# Patient Record
Sex: Male | Born: 1980 | Race: White | Hispanic: No | Marital: Married | Smoking: Current every day smoker
Health system: Southern US, Community
[De-identification: ages and names within clinical notes are randomized; demographics above are authoritative.]

## PROBLEM LIST (undated history)

## (undated) DIAGNOSIS — I2510 Atherosclerotic heart disease of native coronary artery without angina pectoris: Secondary | ICD-10-CM

## (undated) HISTORY — PX: KNEE ARTHROSCOPY: SUR90

---

## 2017-06-30 ENCOUNTER — Encounter (HOSPITAL_COMMUNITY): Payer: Self-pay | Admitting: Emergency Medicine

## 2017-06-30 ENCOUNTER — Emergency Department (HOSPITAL_COMMUNITY): Payer: Federal, State, Local not specified - PPO

## 2017-06-30 ENCOUNTER — Emergency Department (HOSPITAL_COMMUNITY): Payer: Self-pay

## 2017-06-30 ENCOUNTER — Emergency Department (HOSPITAL_COMMUNITY)
Admission: EM | Admit: 2017-06-30 | Discharge: 2017-06-30 | Disposition: A | Discharge status: Home / Self Care | Payer: Self-pay | Attending: Emergency Medicine | Admitting: Emergency Medicine

## 2017-06-30 ENCOUNTER — Inpatient Hospital Stay (HOSPITAL_COMMUNITY)
Admission: EM | Admit: 2017-06-30 | Discharge: 2017-07-03 | DRG: 247 | Disposition: A | Discharge status: Home / Self Care | Payer: Federal, State, Local not specified - PPO | Attending: Cardiology | Admitting: Cardiology

## 2017-06-30 DIAGNOSIS — E876 Hypokalemia: Secondary | ICD-10-CM | POA: Diagnosis present

## 2017-06-30 DIAGNOSIS — F17210 Nicotine dependence, cigarettes, uncomplicated: Secondary | ICD-10-CM | POA: Diagnosis present

## 2017-06-30 DIAGNOSIS — R079 Chest pain, unspecified: Secondary | ICD-10-CM | POA: Insufficient documentation

## 2017-06-30 DIAGNOSIS — Z885 Allergy status to narcotic agent status: Secondary | ICD-10-CM

## 2017-06-30 DIAGNOSIS — I471 Supraventricular tachycardia: Secondary | ICD-10-CM | POA: Diagnosis present

## 2017-06-30 DIAGNOSIS — I452 Bifascicular block: Secondary | ICD-10-CM | POA: Diagnosis present

## 2017-06-30 DIAGNOSIS — N281 Cyst of kidney, acquired: Secondary | ICD-10-CM | POA: Diagnosis present

## 2017-06-30 DIAGNOSIS — Z955 Presence of coronary angioplasty implant and graft: Secondary | ICD-10-CM

## 2017-06-30 DIAGNOSIS — I2510 Atherosclerotic heart disease of native coronary artery without angina pectoris: Secondary | ICD-10-CM | POA: Diagnosis present

## 2017-06-30 DIAGNOSIS — R748 Abnormal levels of other serum enzymes: Secondary | ICD-10-CM

## 2017-06-30 DIAGNOSIS — I214 Non-ST elevation (NSTEMI) myocardial infarction: Principal | ICD-10-CM | POA: Diagnosis present

## 2017-06-30 DIAGNOSIS — Z5321 Procedure and treatment not carried out due to patient leaving prior to being seen by health care provider: Secondary | ICD-10-CM | POA: Insufficient documentation

## 2017-06-30 DIAGNOSIS — I10 Essential (primary) hypertension: Secondary | ICD-10-CM

## 2017-06-30 DIAGNOSIS — R9431 Abnormal electrocardiogram [ECG] [EKG]: Secondary | ICD-10-CM

## 2017-06-30 DIAGNOSIS — I493 Ventricular premature depolarization: Secondary | ICD-10-CM | POA: Diagnosis present

## 2017-06-30 HISTORY — DX: Atherosclerotic heart disease of native coronary artery without angina pectoris: I25.10

## 2017-06-30 LAB — BASIC METABOLIC PANEL
ANION GAP: 12 (ref 5–15)
BUN: 9 mg/dL (ref 6–20)
CALCIUM: 9 mg/dL (ref 8.9–10.3)
CO2: 24 mmol/L (ref 22–32)
CREATININE: 1.05 mg/dL (ref 0.61–1.24)
Chloride: 101 mmol/L (ref 101–111)
GFR calc non Af Amer: >60 mL/min (ref >60)
Glucose, Bld: 104 mg/dL — ABNORMAL HIGH (ref 65–99)
Potassium: 3.2 mmol/L — ABNORMAL LOW (ref 3.5–5.1)
SODIUM: 137 mmol/L (ref 135–145)

## 2017-06-30 LAB — CBC
HCT: 42.8 % (ref 39.0–52.0)
HEMOGLOBIN: 14.9 g/dL (ref 13.0–17.0)
MCH: 29.5 pg (ref 26.0–34.0)
MCHC: 34.8 g/dL (ref 30.0–36.0)
MCV: 84.8 fL (ref 78.0–100.0)
PLATELETS: 247 10*3/uL (ref 150–400)
RBC: 5.05 MIL/uL (ref 4.22–5.81)
RDW: 13.6 % (ref 11.5–15.5)
WBC: 8.9 10*3/uL (ref 4.0–10.5)

## 2017-06-30 LAB — I-STAT TROPONIN, ED: TROPONIN I, POC: 0.15 ng/mL — AB (ref 0.00–0.08)

## 2017-06-30 MED ORDER — HEPARIN BOLUS VIA INFUSION
4000.0000 [IU] | Freq: Once | INTRAVENOUS | Status: AC
  Administered 2017-07-01: 4000 [IU] via INTRAVENOUS
  Filled 2017-06-30: qty 4000

## 2017-06-30 MED ORDER — IOPAMIDOL (ISOVUE-370) INJECTION 76%
INTRAVENOUS | Status: AC
  Filled 2017-06-30: qty 100

## 2017-06-30 MED ORDER — HEPARIN (PORCINE) IN NACL 100-0.45 UNIT/ML-% IJ SOLN
1400.0000 [IU]/h | INTRAMUSCULAR | Status: DC
  Administered 2017-07-01: 1400 [IU]/h via INTRAVENOUS
  Administered 2017-07-01: 1100 [IU]/h via INTRAVENOUS
  Filled 2017-06-30 (×2): qty 250

## 2017-06-30 MED ORDER — ASPIRIN 81 MG PO CHEW
324.0000 mg | CHEWABLE_TABLET | Freq: Once | ORAL | Status: AC
  Administered 2017-07-01: 324 mg via ORAL
  Filled 2017-06-30: qty 4

## 2017-06-30 MED ORDER — IOPAMIDOL (ISOVUE-370) INJECTION 76%
INTRAVENOUS | Status: AC
  Administered 2017-06-30: 100 mL via INTRAVENOUS
  Filled 2017-06-30: qty 100

## 2017-06-30 NOTE — ED Triage Notes (Signed)
Pt presents with central CP that began 2 days ago but waxed and weaned and came back today and has not subsided; pt also reporting diaphoresis, chills, nausea

## 2017-06-30 NOTE — H&P (Signed)
Cardiology Admission History and Physical:   Patient ID: Samuel Myers; MRN: [id number]; DOB: May 05, 1981   Admission date: 06/30/2017  Primary Care Provider: No primary care provider on file. Primary Cardiologist: None  Chief Complaint:  Chest pain,  Patient Profile:   Samuel Myers is a 37 y.o. male with a history of untreated HTN who presents with chest pain and positive troponin.   History of Present Illness:   Samuel Myers is a 37 y.o. male with a history of untreated HTN who presents with chest pain and positive troponin.   The patient has no medical history except for untreated HTN and active smoking. He reports that he has been experiencing intermittent chest pain including an episode several weeks ago and multiple episodes today. He states that the pain comes without trigger and resolves without any particular intervention.  He is visiting the area from [facility] and presented to the [facility] ED earlier today following an episode of severe chest pain with nausea and diaphoresis. He underwent ECG that showed incomplete RBBB and some ST elevation in anterior precordial leads. He was going to be sent back to the waiting room and reportedly did not want to wait for an ED bed and left prior to evaluation. After leaving the [facility] ED, he had another episode of chest pain, and he came to the [facility] ED.  In the ED, the patient was chest pain free. He had HR 50-70s, SBP 110-160s. ECG re-demonstrated incomplete RBBB but did not show any ST elevations. He underwent CTA that showed no PE or any other acute process. Troponin resulted at 0.15. He was started on a heparin infusion, and admission to cardiology was requested.    History reviewed. No pertinent past medical history.  History reviewed. No pertinent surgical history.   Medications Prior to Admission: Prior to Admission medications   Not on File     Allergies:    Allergies  Allergen Reactions  . Hydrocodone Hives     Social History:   Social History   Socioeconomic History  . Marital status: Married    Spouse name: Not on file  . Number of children: Not on file  . Years of education: Not on file  . Highest education level: Not on file  Social Needs  . Financial resource strain: Not on file  . Food insecurity - worry: Not on file  . Food insecurity - inability: Not on file  . Transportation needs - medical: Not on file  . Transportation needs - non-medical: Not on file  Occupational History  . Not on file  Tobacco Use  . Smoking status: Current Every Day Smoker    Packs/day: 0.50  Substance and Sexual Activity  . Alcohol use: No    Frequency: Never    Comment: occ  . Drug use: No  . Sexual activity: Not on file  Other Topics Concern  . Not on file  Social History Narrative  . Not on file    Family History:  Reports spina bifida and prostate cancer in father. Denies any family history of cardiac disease.  ROS:  Please see the history of present illness.  All other ROS reviewed and negative.     Physical Exam/Data:   Vitals:   06/30/17 2145 06/30/17 2200 06/30/17 2230 06/30/17 2300  BP:  122/87 (!) 132/91 114/85  Pulse: 77 (!) 59 61 (!) 52  Resp: (!) 25 19 19 11   Temp:      TempSrc:  SpO2: 97% 97% 97% 98%   No intake or output data in the 24 hours ending 07/01/17 0043 There were no vitals filed for this visit. There is no height or weight on file to calculate BMI.  General:  Well nourished, well developed, in no acute distress  HEENT: normal Neck: no  JVD Cardiac:  normal S1, S2; RRR; no murmur  Lungs:  clear to auscultation bilaterally, no wheezing, rhonchi or rales  Abd: soft, nontender, no hepatomegaly  Ext: no LE edema Musculoskeletal:  No deformities, BUE and BLE strength normal and equal Skin: warm and dry  Neuro:  No focal abnormalities noted Psych:  Normal affect    EKG:  The ECG that was done and was personally reviewed and demonstrates NSR with  incomplete RBBB. No ST elevations (as seen on AP ECG from 1801).   Relevant CV Studies: None  Laboratory Data:  Chemistry Recent Labs  Lab 06/30/17 1907  NA 137  K 3.2*  CL 101  CO2 24  GLUCOSE 104*  BUN 9  CREATININE 1.05  CALCIUM 9.0  GFRNONAA >60  GFRAA >60  ANIONGAP 12    No results for input(s): PROT, ALBUMIN, AST, ALT, ALKPHOS, BILITOT in the last 168 hours. Hematology Recent Labs  Lab 06/30/17 1907  WBC 8.9  RBC 5.05  HGB 14.9  HCT 42.8  MCV 84.8  MCH 29.5  MCHC 34.8  RDW 13.6  PLT 247   Cardiac EnzymesNo results for input(s): TROPONINI in the last 168 hours.  Recent Labs  Lab 06/30/17 1927  TROPIPOC 0.15*    BNPNo results for input(s): BNP, PROBNP in the last 168 hours.  DDimer No results for input(s): DDIMER in the last 168 hours.  Radiology/Studies:  Dg Chest 2 View  Result Date: 06/30/2017 CLINICAL DATA:  Chest pain for 2 days. Diaphoresis. Nausea and vomiting. Chills. EXAM: CHEST  2 VIEW COMPARISON:  None. FINDINGS: The heart size and mediastinal contours are within normal limits. Both lungs are clear. The visualized skeletal structures are unremarkable. IMPRESSION: No active cardiopulmonary disease. Electronically Signed   By: Myles Rosenthal M.D.   On: 06/30/2017 19:40   Ct Angio Chest/abd/pel For Dissection W And/or Wo Contrast  Result Date: 06/30/2017 CLINICAL DATA:  Central chest pain EXAM: CT ANGIOGRAPHY CHEST, ABDOMEN AND PELVIS TECHNIQUE: Multidetector CT imaging through the chest, abdomen and pelvis was performed using the standard protocol during bolus administration of intravenous contrast. Multiplanar reconstructed images and MIPs were obtained and reviewed to evaluate the vascular anatomy. CONTRAST:  ISOVUE-370 IOPAMIDOL (ISOVUE-370) INJECTION 76% COMPARISON:  06/30/2017 radiograph FINDINGS: CTA CHEST FINDINGS Cardiovascular: The extreme lung apices were not included on the initial arterial phase images and a subsequent set of images is  submitted which includes the lung apices and upper mediastinum. Nonaneurysmal aorta. Non contrasted images demonstrate no intramural hematoma. No dissection is seen. Heart size is normal. No pericardial effusion Mediastinum/Nodes: No enlarged mediastinal, hilar, or axillary lymph nodes. Thyroid gland, trachea, and esophagus demonstrate no significant findings. Lungs/Pleura: Lungs are clear. No pleural effusion or pneumothorax. Musculoskeletal: No chest wall abnormality. No acute or significant osseous findings. Review of the MIP images confirms the above findings. CTA ABDOMEN AND PELVIS FINDINGS VASCULAR Aorta: Normal caliber aorta without aneurysm, dissection, vasculitis or significant stenosis. Celiac: Patent without evidence of aneurysm, dissection, vasculitis or significant stenosis. SMA: Patent without evidence of aneurysm, dissection, vasculitis or significant stenosis. Renals: Both renal arteries are patent without evidence of aneurysm, dissection, vasculitis, fibromuscular dysplasia or significant  stenosis. IMA: Patent without evidence of aneurysm, dissection, vasculitis or significant stenosis. Inflow: Patent without evidence of aneurysm, dissection, vasculitis or significant stenosis. Minimal atherosclerotic calcification at the origin of right internal iliac. Veins: No obvious venous abnormality within the limitations of this arterial phase study. Review of the MIP images confirms the above findings. NON-VASCULAR Hepatobiliary: No focal liver abnormality is seen. No gallstones, gallbladder wall thickening, or biliary dilatation. Pancreas: Unremarkable. No pancreatic ductal dilatation or surrounding inflammatory changes. Spleen: Normal in size without focal abnormality. Adrenals/Urinary Tract: Adrenal glands are within normal limits. No hydronephrosis. 17 mm hypodensity in the mid right kidney with slight increased density values. Bladder normal Stomach/Bowel: Stomach is within normal limits. Appendix  appears normal. No evidence of bowel wall thickening, distention, or inflammatory changes. Minimal distal esophageal thickening. Lymphatic: No significantly enlarged lymph nodes Reproductive: Prostate is unremarkable. Other: Negative for free air or free fluid. Small fat in the umbilical region Musculoskeletal: No acute or significant osseous findings. Review of the MIP images confirms the above findings. IMPRESSION: 1. Negative for acute aortic dissection or aneurysm. 2. Clear lung fields. 3. No significant vascular stenosis within the abdomen or pelvis 4. 17 mm hypodense lesion mid right kidney, not compatible with simple cyst. When the patient is clinically stable and able to follow directions and hold their breath (preferably as an outpatient) further evaluation with dedicated abdominal MRI should be considered. 5. Minimal distal esophageal thickening, could be due to mild esophagitis or reflux. Electronically Signed   By: Jasmine Pang M.D.   On: 06/30/2017 22:48    Assessment and Plan:   Chest pain Positive troponin Abnormal ECG The patient has no known cardiac history but has had several episodes of severe chest pain that occurs without trigger and resolves spontaneously. He had an ECG with concerning ST elevations at [facility] earlier today, but he left the ED prior to evaluation. His subsequent ECG at [facility] does not show these ST segment changes, although he has modestly elevated troponin. He is currently chest pain free. The cause of his clinical findings is not clear. Given his intermittent symptoms and dynamic ECG changes, query possible coronary vasospasm. ACS is also possible, although primary plaque rupture leading to STEMI is unlikely to resolve spontaneously. CTA shows no PE or other acute findings. Heparin infusion has been started in the ED. At this time, empiric ACS treatment is reasonable, and the patient will need further ischemic evaluation. Will also start treatment for possible  coronary vasospasm with continued monitoring of symptoms. -Monitor on telemetry -Repeat ECG for recurrent CP -Continue heparin gtt -Continue ASA -NTG PRN ordered -Will start empiric diltiazem for HTN and possible vasospasm -Echocardiogram ordered -Lipids, HgA1c ordered -Importance of moking cessation emphasized.  -NPO at midnight for possible ischemic evaluation in AM  HTN Not currently treated. -Starting diltiazem as above  Kidney Cyst Noted on CTA in ED -Will need continued monitoring/further evaluation   Severity of Illness: The appropriate patient status for this patient is INPATIENT. Inpatient status is judged to be reasonable and necessary in order to provide the required intensity of service to ensure the patient's safety. The patient's presenting symptoms, physical exam findings, and initial radiographic and laboratory data in the context of their chronic comorbidities is felt to place them at high risk for further clinical deterioration. Furthermore, it is not anticipated that the patient will be medically stable for discharge from the hospital within 2 midnights of admission. The following factors support the patient status of  inpatient.   " The patient's presenting symptoms include chest pain. " The worrisome physical exam findings include n/a. " The initial radiographic and laboratory data are worrisome because of positive troponin, dynamic ECG changes. " The chronic co-morbidities include HTN, smoking.   * I certify that at the point of admission it is my clinical judgment that the patient will require inpatient hospital care spanning beyond 2 midnights from the point of admission due to high intensity of service, high risk for further deterioration and high frequency of surveillance required.*    For questions or updates, please contact [facility] Please consult [email or web address] for contact info under Cardiology/STEMI.    Signed, Ernest Mallickaylor Dillin Lofgren, MD  07/01/2017  12:43 AM

## 2017-06-30 NOTE — ED Notes (Signed)
Patient transported to CT 

## 2017-06-30 NOTE — Progress Notes (Signed)
ANTICOAGULATION CONSULT NOTE - Initial Consult  Pharmacy Consult for heparin Indication: chest pain/ACS  Allergies  Allergen Reactions  . Hydrocodone Hives    Patient Measurements:   Heparin Dosing Weight: 81.6 kg  Vital Signs: Temp: 98.3 F (36.8 C) (02/23 2110) Temp Source: Oral (02/23 2110) BP: 114/85 (02/23 2300) Pulse Rate: 52 (02/23 2300)  Labs: Recent Labs    06/30/17 1907  HGB 14.9  HCT 42.8  PLT 247  CREATININE 1.05    Assessment: 37 yo male with acute chest pain that began a couple days ago. With positive troponin. Reportedly had abnormal EKG earlier today, now EKG normal. Starting heparin gtt until ACS ruled out   Goal of Therapy:  Heparin level 0.3-0.7 units/ml Monitor platelets by anticoagulation protocol: Yes    Plan:  -Heparin 4000 units x1 then 1100 units/hr -Daily HL, CBC -HL in 6 hours   Baldemar FridayMasters, Neithan Day M 06/30/2017,11:54 PM

## 2017-06-30 NOTE — ED Notes (Signed)
Pt informed that triage orders had been placed and EKG performed. Pt reported "I am not waiting that long for a room." pt and pt family left.

## 2017-06-30 NOTE — ED Provider Notes (Signed)
Emergency Department Provider Note   I have reviewed the triage vital signs and the nursing notes.   HISTORY  Chief Complaint Chest Pain; Chills; Excessive Sweating; and Nausea   HPI Samuel Myers is a 37 y.o. male with history of hypertension is untreated the presents to the emergency department today secondary to chest pain.  Patient states that a few episodes of chest pain over the last month the last about a minute or so and go away without any problems.  Symptoms of nausea and dyspnea with him symptoms he will.  Today he had an episode where he had acute onset of "feeling like somebody was tearing my chest out" and went to any pain.  He had vomiting, diaphoresis, shortness of breath with this as well.  He felt "like I was dying".  He went to have any pain hospital where EKG in the system showed hyperacute T waves in month and her ST elevation however he did not want to wait for a bed so he left there and went home.  He continued to have severe pain so came here for evaluation.  And on the way here chest pain resolved again is currently symptom-free.  No other known history but does smoke. No other associated or modifying symptoms.    History reviewed. No pertinent past medical history.  There are no active problems to display for this patient.   History reviewed. No pertinent surgical history.   Allergies Hydrocodone  History reviewed. No pertinent family history.  Social History Social History   Tobacco Use  . Smoking status: Current Every Day Smoker    Packs/day: 0.50  Substance Use Topics  . Alcohol use: No    Frequency: Never    Comment: occ  . Drug use: No    Review of Systems  All other systems negative except as documented in the HPI. All pertinent positives and negatives as reviewed in the HPI. ____________________________________________   PHYSICAL EXAM:  VITAL SIGNS: ED Triage Vitals  Enc Vitals Group     BP 06/30/17 1901 (!) 168/115     Pulse  Rate 06/30/17 1901 71     Resp 06/30/17 1901 20     Temp 06/30/17 1901 98 F (36.7 C)     Temp Source 06/30/17 1901 Oral     SpO2 06/30/17 1901 98 %    Constitutional: Alert and oriented. Well appearing and in no acute distress. Eyes: Conjunctivae are normal. PERRL. EOMI. Head: Atraumatic. Nose: No congestion/rhinnorhea. Mouth/Throat: Mucous membranes are moist.  Oropharynx non-erythematous. Neck: No stridor.  No meningeal signs.   Cardiovascular: Normal rate, regular rhythm. Good peripheral circulation. Grossly normal heart sounds.   Respiratory: Normal respiratory effort.  No retractions. Lungs CTAB. Gastrointestinal: Soft and nontender. No distention.  Musculoskeletal: No lower extremity tenderness nor edema. No gross deformities of extremities. Neurologic:  Normal speech and language. No gross focal neurologic deficits are appreciated.  Skin:  Skin is warm, dry and intact. No rash noted.  ____________________________________________   LABS (all labs ordered are listed, but only abnormal results are displayed)  Labs Reviewed  BASIC METABOLIC PANEL - Abnormal; Notable for the following components:      Result Value   Potassium 3.2 (*)    Glucose, Bld 104 (*)    All other components within normal limits  I-STAT TROPONIN, ED - Abnormal; Notable for the following components:   Troponin i, poc 0.15 (*)    All other components within normal limits  CBC  ____________________________________________  EKG   EKG Interpretation  Date/Time:  Saturday June 30 2017 18:57:12 EST Ventricular Rate:  70 PR Interval:  122 QRS Duration: 112 QT Interval:  382 QTC Calculation: 412 R Axis:   -13 Text Interpretation:  Normal sinus rhythm Incomplete right bundle branch block Borderline ECG improved amplitude of t waves in anterior/septal leads since earlier today Confirmed by Marily Memos [phone]) on 06/30/2017 7:56:58 PM       EKG Interpretation  Date/Time:  Saturday June 30 2017 21:11:52 EST Ventricular Rate:  60 PR Interval:  122 QRS Duration: 120 QT Interval:  380 QTC Calculation: 380 R Axis:   -2 Text Interpretation:  Sinus rhythm Incomplete RBBB and LAFB No significant change since last tracing Confirmed by Marily Memos [phone]) on 06/30/2017 11:43:48 PM        ____________________________________________  RADIOLOGY  Dg Chest 2 View  Result Date: 06/30/2017 CLINICAL DATA:  Chest pain for 2 days. Diaphoresis. Nausea and vomiting. Chills. EXAM: CHEST  2 VIEW COMPARISON:  None. FINDINGS: The heart size and mediastinal contours are within normal limits. Both lungs are clear. The visualized skeletal structures are unremarkable. IMPRESSION: No active cardiopulmonary disease. Electronically Signed   By: Myles Rosenthal M.D.   On: 06/30/2017 19:40   Ct Angio Chest/abd/pel For Dissection W And/or Wo Contrast  Result Date: 06/30/2017 CLINICAL DATA:  Central chest pain EXAM: CT ANGIOGRAPHY CHEST, ABDOMEN AND PELVIS TECHNIQUE: Multidetector CT imaging through the chest, abdomen and pelvis was performed using the standard protocol during bolus administration of intravenous contrast. Multiplanar reconstructed images and MIPs were obtained and reviewed to evaluate the vascular anatomy. CONTRAST:  ISOVUE-370 IOPAMIDOL (ISOVUE-370) INJECTION 76% COMPARISON:  06/30/2017 radiograph FINDINGS: CTA CHEST FINDINGS Cardiovascular: The extreme lung apices were not included on the initial arterial phase images and a subsequent set of images is submitted which includes the lung apices and upper mediastinum. Nonaneurysmal aorta. Non contrasted images demonstrate no intramural hematoma. No dissection is seen. Heart size is normal. No pericardial effusion Mediastinum/Nodes: No enlarged mediastinal, hilar, or axillary lymph nodes. Thyroid gland, trachea, and esophagus demonstrate no significant findings. Lungs/Pleura: Lungs are clear. No pleural effusion or pneumothorax.  Musculoskeletal: No chest wall abnormality. No acute or significant osseous findings. Review of the MIP images confirms the above findings. CTA ABDOMEN AND PELVIS FINDINGS VASCULAR Aorta: Normal caliber aorta without aneurysm, dissection, vasculitis or significant stenosis. Celiac: Patent without evidence of aneurysm, dissection, vasculitis or significant stenosis. SMA: Patent without evidence of aneurysm, dissection, vasculitis or significant stenosis. Renals: Both renal arteries are patent without evidence of aneurysm, dissection, vasculitis, fibromuscular dysplasia or significant stenosis. IMA: Patent without evidence of aneurysm, dissection, vasculitis or significant stenosis. Inflow: Patent without evidence of aneurysm, dissection, vasculitis or significant stenosis. Minimal atherosclerotic calcification at the origin of right internal iliac. Veins: No obvious venous abnormality within the limitations of this arterial phase study. Review of the MIP images confirms the above findings. NON-VASCULAR Hepatobiliary: No focal liver abnormality is seen. No gallstones, gallbladder wall thickening, or biliary dilatation. Pancreas: Unremarkable. No pancreatic ductal dilatation or surrounding inflammatory changes. Spleen: Normal in size without focal abnormality. Adrenals/Urinary Tract: Adrenal glands are within normal limits. No hydronephrosis. 17 mm hypodensity in the mid right kidney with slight increased density values. Bladder normal Stomach/Bowel: Stomach is within normal limits. Appendix appears normal. No evidence of bowel wall thickening, distention, or inflammatory changes. Minimal distal esophageal thickening. Lymphatic: No significantly enlarged lymph nodes Reproductive: Prostate is unremarkable. Other: Negative for free  air or free fluid. Small fat in the umbilical region Musculoskeletal: No acute or significant osseous findings. Review of the MIP images confirms the above findings. IMPRESSION: 1. Negative  for acute aortic dissection or aneurysm. 2. Clear lung fields. 3. No significant vascular stenosis within the abdomen or pelvis 4. 17 mm hypodense lesion mid right kidney, not compatible with simple cyst. When the patient is clinically stable and able to follow directions and hold their breath (preferably as an outpatient) further evaluation with dedicated abdominal MRI should be considered. 5. Minimal distal esophageal thickening, could be due to mild esophagitis or reflux. Electronically Signed   By: Jasmine Pang M.D.   On: 06/30/2017 22:48    ____________________________________________   PROCEDURES  Procedure(s) performed:   Procedures  CRITICAL CARE Performed by: Marily Memos Total critical care time: 35 minutes Critical care time was exclusive of separately billable procedures and treating other patients. Critical care was necessary to treat or prevent imminent or life-threatening deterioration. Critical care was time spent personally by me on the following activities: development of treatment plan with patient and/or surrogate as well as nursing, discussions with consultants, evaluation of patient's response to treatment, examination of patient, obtaining history from patient or surrogate, ordering and performing treatments and interventions, ordering and review of laboratory studies, ordering and review of radiographic studies, pulse oximetry and re-evaluation of patient's condition.  ____________________________________________   INITIAL IMPRESSION / ASSESSMENT AND PLAN / ED COURSE  37 year old male with what seems to be dynamic EKG changes and a positive troponin.  Dissection study negative for PE or dissection or other acute causes for symptoms.  Started on a heparin drip.  Will admit to cardiology for further management.  Aspirin given.  Chest pain-free at this time blood pressure controlled.  Pertinent labs & imaging results that were available during my care of the patient  were reviewed by me and considered in my medical decision making (see chart for details).  ____________________________________________  FINAL CLINICAL IMPRESSION(S) / ED DIAGNOSES  Final diagnoses:  NSTEMI (non-ST elevated myocardial infarction) (HCC)    MEDICATIONS GIVEN DURING THIS VISIT:  Medications  iopamidol (ISOVUE-370) 76 % injection (not administered)  aspirin chewable tablet 324 mg (not administered)  iopamidol (ISOVUE-370) 76 % injection (100 mLs Intravenous Contrast Given 06/30/17 2220)    NEW OUTPATIENT MEDICATIONS STARTED DURING THIS VISIT:  New Prescriptions   No medications on file    Note:  This note was prepared with assistance of Dragon voice recognition software. Occasional wrong-word or sound-a-like substitutions may have occurred due to the inherent limitations of voice recognition software.   Marily Memos, MD 06/30/17 [phone]

## 2017-06-30 NOTE — ED Notes (Signed)
Provided urinal. °

## 2017-06-30 NOTE — ED Triage Notes (Signed)
Pt reports sudden onset chest pain today radiation to left arm.

## 2017-07-01 ENCOUNTER — Other Ambulatory Visit: Payer: Self-pay

## 2017-07-01 DIAGNOSIS — I361 Nonrheumatic tricuspid (valve) insufficiency: Secondary | ICD-10-CM | POA: Diagnosis not present

## 2017-07-01 DIAGNOSIS — I214 Non-ST elevation (NSTEMI) myocardial infarction: Secondary | ICD-10-CM

## 2017-07-01 DIAGNOSIS — I493 Ventricular premature depolarization: Secondary | ICD-10-CM | POA: Diagnosis present

## 2017-07-01 DIAGNOSIS — N281 Cyst of kidney, acquired: Secondary | ICD-10-CM | POA: Diagnosis present

## 2017-07-01 DIAGNOSIS — I2510 Atherosclerotic heart disease of native coronary artery without angina pectoris: Secondary | ICD-10-CM | POA: Diagnosis present

## 2017-07-01 DIAGNOSIS — I471 Supraventricular tachycardia: Secondary | ICD-10-CM | POA: Diagnosis present

## 2017-07-01 DIAGNOSIS — R079 Chest pain, unspecified: Secondary | ICD-10-CM | POA: Diagnosis present

## 2017-07-01 DIAGNOSIS — Z885 Allergy status to narcotic agent status: Secondary | ICD-10-CM | POA: Diagnosis not present

## 2017-07-01 DIAGNOSIS — I10 Essential (primary) hypertension: Secondary | ICD-10-CM | POA: Diagnosis present

## 2017-07-01 DIAGNOSIS — E876 Hypokalemia: Secondary | ICD-10-CM | POA: Diagnosis present

## 2017-07-01 DIAGNOSIS — F17210 Nicotine dependence, cigarettes, uncomplicated: Secondary | ICD-10-CM | POA: Diagnosis present

## 2017-07-01 DIAGNOSIS — R9431 Abnormal electrocardiogram [ECG] [EKG]: Secondary | ICD-10-CM | POA: Diagnosis not present

## 2017-07-01 DIAGNOSIS — I452 Bifascicular block: Secondary | ICD-10-CM | POA: Diagnosis present

## 2017-07-01 LAB — TROPONIN I
TROPONIN I: 2.78 ng/mL — AB (ref <0.03)
TROPONIN I: 2.11 ng/mL — AB (ref <0.03)
TROPONIN I: 1.55 ng/mL — AB (ref <0.03)

## 2017-07-01 LAB — BASIC METABOLIC PANEL
Anion gap: 11 (ref 5–15)
BUN: 7 mg/dL (ref 6–20)
CO2: 24 mmol/L (ref 22–32)
Calcium: 8.8 mg/dL — ABNORMAL LOW (ref 8.9–10.3)
Chloride: 104 mmol/L (ref 101–111)
Creatinine, Ser: 0.95 mg/dL (ref 0.61–1.24)
GFR calc Af Amer: >60 mL/min (ref >60)
GLUCOSE: 116 mg/dL — AB (ref 65–99)
POTASSIUM: 3 mmol/L — AB (ref 3.5–5.1)
Sodium: 139 mmol/L (ref 135–145)

## 2017-07-01 LAB — BRAIN NATRIURETIC PEPTIDE: B NATRIURETIC PEPTIDE 5: 81.2 pg/mL (ref 0.0–100.0)

## 2017-07-01 LAB — CBC
HCT: 40.3 % (ref 39.0–52.0)
Hemoglobin: 14 g/dL (ref 13.0–17.0)
MCH: 29.3 pg (ref 26.0–34.0)
MCHC: 34.7 g/dL (ref 30.0–36.0)
MCV: 84.3 fL (ref 78.0–100.0)
Platelets: 237 10*3/uL (ref 150–400)
RBC: 4.78 MIL/uL (ref 4.22–5.81)
RDW: 13.5 % (ref 11.5–15.5)
WBC: 9.9 10*3/uL (ref 4.0–10.5)

## 2017-07-01 LAB — HEPARIN LEVEL (UNFRACTIONATED)
HEPARIN UNFRACTIONATED: 0.26 [IU]/mL — AB (ref 0.30–0.70)
HEPARIN UNFRACTIONATED: 0.21 [IU]/mL — AB (ref 0.30–0.70)
HEPARIN UNFRACTIONATED: 0.42 [IU]/mL (ref 0.30–0.70)

## 2017-07-01 LAB — LIPID PANEL
CHOL/HDL RATIO: 6.8 ratio
Cholesterol: 177 mg/dL (ref 0–200)
HDL: 26 mg/dL — ABNORMAL LOW (ref >40)
LDL CALC: 77 mg/dL (ref 0–99)
Triglycerides: 371 mg/dL — ABNORMAL HIGH (ref <150)
VLDL: 74 mg/dL — AB (ref 0–40)

## 2017-07-01 LAB — PROTIME-INR
INR: 0.96
Prothrombin Time: 12.7 seconds (ref 11.4–15.2)

## 2017-07-01 LAB — T4, FREE: FREE T4: 1.05 ng/dL (ref 0.61–1.12)

## 2017-07-01 LAB — HIV ANTIBODY (ROUTINE TESTING W REFLEX): HIV SCREEN 4TH GENERATION: NONREACTIVE

## 2017-07-01 MED ORDER — ACETAMINOPHEN 325 MG PO TABS
650.0000 mg | ORAL_TABLET | ORAL | Status: DC | PRN
  Administered 2017-07-02: 15:00:00 650 mg via ORAL
  Filled 2017-07-01: qty 2

## 2017-07-01 MED ORDER — POTASSIUM CHLORIDE CRYS ER 20 MEQ PO TBCR
40.0000 meq | EXTENDED_RELEASE_TABLET | Freq: Once | ORAL | Status: AC
  Administered 2017-07-01: 40 meq via ORAL
  Filled 2017-07-01: qty 2

## 2017-07-01 MED ORDER — ATORVASTATIN CALCIUM 40 MG PO TABS
40.0000 mg | ORAL_TABLET | Freq: Every day | ORAL | Status: DC
  Administered 2017-07-01: 40 mg via ORAL
  Filled 2017-07-01: qty 1

## 2017-07-01 MED ORDER — ASPIRIN 81 MG PO CHEW
81.0000 mg | CHEWABLE_TABLET | ORAL | Status: AC
  Administered 2017-07-02: 81 mg via ORAL
  Filled 2017-07-01: qty 1

## 2017-07-01 MED ORDER — SODIUM CHLORIDE 0.9% FLUSH: 3.0000 mL | INTRAVENOUS | Status: DC | PRN

## 2017-07-01 MED ORDER — SODIUM CHLORIDE 0.9% FLUSH: 3.0000 mL | Freq: Two times a day (BID) | INTRAVENOUS | Status: DC

## 2017-07-01 MED ORDER — HEPARIN BOLUS VIA INFUSION
1000.0000 [IU] | Freq: Once | INTRAVENOUS | Status: AC
  Administered 2017-07-01: 1000 [IU] via INTRAVENOUS
  Filled 2017-07-01: qty 1000

## 2017-07-01 MED ORDER — SODIUM CHLORIDE 0.9 % IV SOLN: 250.0000 mL | INTRAVENOUS | Status: DC | PRN

## 2017-07-01 MED ORDER — ONDANSETRON HCL 4 MG/2ML IJ SOLN: 4.0000 mg | Freq: Four times a day (QID) | INTRAMUSCULAR | Status: DC | PRN

## 2017-07-01 MED ORDER — DILTIAZEM HCL ER 60 MG PO CP12
60.0000 mg | ORAL_CAPSULE | Freq: Two times a day (BID) | ORAL | Status: DC
  Administered 2017-07-01 – 2017-07-02 (×3): 60 mg via ORAL
  Filled 2017-07-01 (×3): qty 1

## 2017-07-01 MED ORDER — SODIUM CHLORIDE 0.9 % WEIGHT BASED INFUSION: 1.0000 mL/kg/h | INTRAVENOUS | Status: DC

## 2017-07-01 MED ORDER — SODIUM CHLORIDE 0.9 % WEIGHT BASED INFUSION
3.0000 mL/kg/h | INTRAVENOUS | Status: DC
  Administered 2017-07-02: 3 mL/kg/h via INTRAVENOUS

## 2017-07-01 MED ORDER — NITROGLYCERIN 0.4 MG SL SUBL: 0.4000 mg | SUBLINGUAL_TABLET | SUBLINGUAL | Status: DC | PRN

## 2017-07-01 MED ORDER — ASPIRIN EC 81 MG PO TBEC
81.0000 mg | DELAYED_RELEASE_TABLET | Freq: Every day | ORAL | Status: DC
  Administered 2017-07-03: 81 mg via ORAL
  Filled 2017-07-01: qty 1

## 2017-07-01 NOTE — Progress Notes (Signed)
ANTICOAGULATION CONSULT NOTE - Follow Up Consult  Pharmacy Consult for heparin Indication: NSTEMI  Labs: Recent Labs    06/30/17 1907 07/01/17 0327 07/01/17 0407 07/01/17 [id number] 07/01/17 0905 07/01/17 1427 07/01/17 2118  HGB 14.9  --  14.0  --   --   --   --   HCT 42.8  --  40.3  --   --   --   --   PLT 247  --  237  --   --   --   --   LABPROT  --   --   --   --   --   --  12.7  INR  --   --   --   --   --   --  0.96  HEPARINUNFRC  --   --   --  0.26*  --  0.21* 0.42  CREATININE 1.05  --  0.95  --   --   --   --   TROPONINI  --  2.78*  --   --  2.11* 1.55*  --     Assessment/Plan:  37yo male therapeutic on heparin after rate changes. Will continue gtt at current rate and confirm stable with am labs.   Vernard GamblesVeronda Carlen Fils, PharmD, BCPS  07/01/2017,10:58 PM

## 2017-07-01 NOTE — Progress Notes (Signed)
Progress Note  Patient Name: Samuel Myers Date of Encounter: 07/01/2017  Primary Cardiologist: No primary care provider on file.   Subjective   Denies chest pain at present.  Inpatient Medications    Scheduled Meds: . [START ON 07/02/2017] aspirin EC  81 mg Oral Daily  . diltiazem  60 mg Oral Q12H   Continuous Infusions: . heparin 1,250 Units/hr (07/01/17 0745)   PRN Meds: acetaminophen, nitroGLYCERIN, ondansetron (ZOFRAN) IV   Vital Signs    Vitals:   07/01/17 0230 07/01/17 0300 07/01/17 0353 07/01/17 0900  BP: 103/66 119/79 119/82 115/76  Pulse: 80 78 67 (!) 59  Resp: 13 13 18 18   Temp:   98.6 F (37 C) 98.4 F (36.9 C)  TempSrc:   Oral Oral  SpO2: 97% 97% 97% 96%  Weight:   185 lb 3.2 oz (84 kg)   Height:   5\' 9"  (1.753 m)     Intake/Output Summary (Last 24 hours) at 07/01/2017 1020 Last data filed at 07/01/2017 0745 Gross per 24 hour  Intake 74.43 ml  Output 1250 ml  Net -1175.57 ml   Filed Weights   07/01/17 0353  Weight: 185 lb 3.2 oz (84 kg)    Telemetry    Sinus rhythm, PVC's, 3-beats of NSVT - Personally Reviewed  ECG    Sinus rhythm, incomplete RBBB, diffuse precordial T wave inversions with nonspecific ST elevation in V2-3, consider anterolateral ischemia - Personally Reviewed  Physical Exam   GEN: No acute distress.   Neck: No JVD Cardiac: RRR, no murmurs, rubs, or gallops.  Respiratory: Clear to auscultation bilaterally. GI: Soft, nontender, non-distended  MS: No edema; No deformity. Neuro:  Nonfocal  Psych: Normal affect   Labs    Chemistry Recent Labs  Lab 06/30/17 1907 07/01/17 0407  NA 137 139  K 3.2* 3.0*  CL 101 104  CO2 24 24  GLUCOSE 104* 116*  BUN 9 7  CREATININE 1.05 0.95  CALCIUM 9.0 8.8*  GFRNONAA >60 >60  GFRAA >60 >60  ANIONGAP 12 11     Hematology Recent Labs  Lab 06/30/17 1907 07/01/17 0407  WBC 8.9 9.9  RBC 5.05 4.78  HGB 14.9 14.0  HCT 42.8 40.3  MCV 84.8 84.3  MCH 29.5 29.3  MCHC 34.8  34.7  RDW 13.6 13.5  PLT 247 237    Cardiac Enzymes Recent Labs  Lab 07/01/17 0327  TROPONINI 2.78*    Recent Labs  Lab 06/30/17 1927  TROPIPOC 0.15*     BNP Recent Labs  Lab 07/01/17 0359  BNP 81.2     DDimer No results for input(s): DDIMER in the last 168 hours.   Radiology    Dg Chest 2 View  Result Date: 06/30/2017 CLINICAL DATA:  Chest pain for 2 days. Diaphoresis. Nausea and vomiting. Chills. EXAM: CHEST  2 VIEW COMPARISON:  None. FINDINGS: The heart size and mediastinal contours are within normal limits. Both lungs are clear. The visualized skeletal structures are unremarkable. IMPRESSION: No active cardiopulmonary disease. Electronically Signed   By: Myles Rosenthal M.D.   On: 06/30/2017 19:40   Ct Angio Chest/abd/pel For Dissection W And/or Wo Contrast  Result Date: 06/30/2017 CLINICAL DATA:  Central chest pain EXAM: CT ANGIOGRAPHY CHEST, ABDOMEN AND PELVIS TECHNIQUE: Multidetector CT imaging through the chest, abdomen and pelvis was performed using the standard protocol during bolus administration of intravenous contrast. Multiplanar reconstructed images and MIPs were obtained and reviewed to evaluate the vascular anatomy. CONTRAST:  ISOVUE-370  IOPAMIDOL (ISOVUE-370) INJECTION 76% COMPARISON:  06/30/2017 radiograph FINDINGS: CTA CHEST FINDINGS Cardiovascular: The extreme lung apices were not included on the initial arterial phase images and a subsequent set of images is submitted which includes the lung apices and upper mediastinum. Nonaneurysmal aorta. Non contrasted images demonstrate no intramural hematoma. No dissection is seen. Heart size is normal. No pericardial effusion Mediastinum/Nodes: No enlarged mediastinal, hilar, or axillary lymph nodes. Thyroid gland, trachea, and esophagus demonstrate no significant findings. Lungs/Pleura: Lungs are clear. No pleural effusion or pneumothorax. Musculoskeletal: No chest wall abnormality. No acute or significant osseous  findings. Review of the MIP images confirms the above findings. CTA ABDOMEN AND PELVIS FINDINGS VASCULAR Aorta: Normal caliber aorta without aneurysm, dissection, vasculitis or significant stenosis. Celiac: Patent without evidence of aneurysm, dissection, vasculitis or significant stenosis. SMA: Patent without evidence of aneurysm, dissection, vasculitis or significant stenosis. Renals: Both renal arteries are patent without evidence of aneurysm, dissection, vasculitis, fibromuscular dysplasia or significant stenosis. IMA: Patent without evidence of aneurysm, dissection, vasculitis or significant stenosis. Inflow: Patent without evidence of aneurysm, dissection, vasculitis or significant stenosis. Minimal atherosclerotic calcification at the origin of right internal iliac. Veins: No obvious venous abnormality within the limitations of this arterial phase study. Review of the MIP images confirms the above findings. NON-VASCULAR Hepatobiliary: No focal liver abnormality is seen. No gallstones, gallbladder wall thickening, or biliary dilatation. Pancreas: Unremarkable. No pancreatic ductal dilatation or surrounding inflammatory changes. Spleen: Normal in size without focal abnormality. Adrenals/Urinary Tract: Adrenal glands are within normal limits. No hydronephrosis. 17 mm hypodensity in the mid right kidney with slight increased density values. Bladder normal Stomach/Bowel: Stomach is within normal limits. Appendix appears normal. No evidence of bowel wall thickening, distention, or inflammatory changes. Minimal distal esophageal thickening. Lymphatic: No significantly enlarged lymph nodes Reproductive: Prostate is unremarkable. Other: Negative for free air or free fluid. Small fat in the umbilical region Musculoskeletal: No acute or significant osseous findings. Review of the MIP images confirms the above findings. IMPRESSION: 1. Negative for acute aortic dissection or aneurysm. 2. Clear lung fields. 3. No  significant vascular stenosis within the abdomen or pelvis 4. 17 mm hypodense lesion mid right kidney, not compatible with simple cyst. When the patient is clinically stable and able to follow directions and hold their breath (preferably as an outpatient) further evaluation with dedicated abdominal MRI should be considered. 5. Minimal distal esophageal thickening, could be due to mild esophagitis or reflux. Electronically Signed   By: Jasmine Pang M.D.   On: 06/30/2017 22:48    Cardiac Studies   Echo (pending)  Patient Profile     37 y.o. male with untreated hypertension admitted with chest pain and troponin elevation with abnormal ECG.  Assessment & Plan    1. Chest pain with abnormal ECG and troponin elevation (NSTEMI): Currently symptomatically stable. Some consideration given for coronary vasospasm for which he was started on diltiazem. Troponins: 0.15 (POC)-->2.78. Given this significant rise, he will require coronary angiography to definitely exclude coronary artery disease. Continue ASA and IV heparin. BP is normal. HR high 50 bpm range. For the time being, I will hold off on beta blockers once we know the coronary anatomy. LDL 77, TC 177, TG 371, HDL 26. I will preemptively start statin therapy which can be discontinued if coronary anatomy is normal. Echocardiogram is pending. Risks and benefits of cardiac catheterization have been discussed with the patient.  These include bleeding, infection, kidney damage, stroke, heart attack, death.  The patient understands these  risks and is willing to proceed.  2. Hypertension: BP is now controlled with addition of diltiazem. No changes.  3. Hypokalemia: I will redorder KCl supplementation.     For questions or updates, please contact [facility] Please consult [email or web address] for contact info under Cardiology/STEMI.      Signed, Prentice DockerSuresh Koneswaran, MD  07/01/2017, 10:20 AM

## 2017-07-01 NOTE — Progress Notes (Signed)
ANTICOAGULATION CONSULT NOTE   Pharmacy Consult for heparin Indication: chest pain/ACS  Allergies  Allergen Reactions  . Hydrocodone Hives    Patient Measurements: Height: 5\' 9"  (175.3 cm) Weight: 185 lb 3.2 oz (84 kg) IBW/kg (Calculated) : 70.7 Heparin Dosing Weight: 81.6 kg  Vital Signs: Temp: 98.6 F (37 C) (02/24 1217) Temp Source: Oral (02/24 1217) BP: 101/66 (02/24 1217) Pulse Rate: 56 (02/24 1217)  Labs: Recent Labs    06/30/17 1907 07/01/17 0327 07/01/17 0407 07/01/17 0634 07/01/17 0905 07/01/17 1427  HGB 14.9  --  14.0  --   --   --   HCT 42.8  --  40.3  --   --   --   PLT 247  --  237  --   --   --   HEPARINUNFRC  --   --   --  0.26*  --  0.21*  CREATININE 1.05  --  0.95  --   --   --   TROPONINI  --  2.78*  --   --  2.11*  --     Assessment: 37 yo male with acute chest pain that began a couple days ago and noted with NSTEMI. Pharmacy dosing heparin  -heparin level = 0.21  Goal of Therapy:  Heparin level 0.3-0.7 units/ml Monitor platelets by anticoagulation protocol: Yes    Plan:  -heparin 1000 unit bolus then increase to 1450 units/hr -Heparin level in 6 hours and daily wth CBC daily  Harland GermanAndrew Detron Carras, Pharm D 07/01/2017 3:24 PM

## 2017-07-01 NOTE — ED Notes (Signed)
Report attempted, RN to call back. 

## 2017-07-01 NOTE — Progress Notes (Addendum)
Troponin 2.78, on Heparin drip, no s/s, MD notified, will continue to monitor, THanks Lavonda JumboMike F RN.

## 2017-07-02 ENCOUNTER — Encounter (HOSPITAL_COMMUNITY): Payer: Self-pay | Admitting: Interventional Cardiology

## 2017-07-02 ENCOUNTER — Inpatient Hospital Stay (HOSPITAL_COMMUNITY)
Admission: EM | Disposition: A | Discharge status: Home / Self Care | Payer: Self-pay | Source: Home / Self Care | Attending: Cardiology

## 2017-07-02 ENCOUNTER — Inpatient Hospital Stay (HOSPITAL_COMMUNITY): Payer: Federal, State, Local not specified - PPO

## 2017-07-02 DIAGNOSIS — I361 Nonrheumatic tricuspid (valve) insufficiency: Secondary | ICD-10-CM

## 2017-07-02 DIAGNOSIS — I2510 Atherosclerotic heart disease of native coronary artery without angina pectoris: Secondary | ICD-10-CM

## 2017-07-02 HISTORY — PX: LEFT HEART CATH AND CORONARY ANGIOGRAPHY: CATH118249

## 2017-07-02 HISTORY — PX: CORONARY STENT INTERVENTION: CATH118234

## 2017-07-02 LAB — BASIC METABOLIC PANEL
Anion gap: 11 (ref 5–15)
BUN: 9 mg/dL (ref 6–20)
CO2: 24 mmol/L (ref 22–32)
CREATININE: 0.89 mg/dL (ref 0.61–1.24)
Calcium: 9 mg/dL (ref 8.9–10.3)
Chloride: 104 mmol/L (ref 101–111)
GFR calc Af Amer: >60 mL/min (ref >60)
GFR calc non Af Amer: >60 mL/min (ref >60)
GLUCOSE: 119 mg/dL — AB (ref 65–99)
Potassium: 3.9 mmol/L (ref 3.5–5.1)
Sodium: 139 mmol/L (ref 135–145)

## 2017-07-02 LAB — CBC
HCT: 41.5 % (ref 39.0–52.0)
HEMOGLOBIN: 14 g/dL (ref 13.0–17.0)
MCH: 28.7 pg (ref 26.0–34.0)
MCHC: 33.7 g/dL (ref 30.0–36.0)
MCV: 85.2 fL (ref 78.0–100.0)
Platelets: 224 10*3/uL (ref 150–400)
RBC: 4.87 MIL/uL (ref 4.22–5.81)
RDW: 13.5 % (ref 11.5–15.5)
WBC: 7.1 10*3/uL (ref 4.0–10.5)

## 2017-07-02 LAB — HEMOGLOBIN A1C
HEMOGLOBIN A1C: 5.2 % (ref 4.8–5.6)
MEAN PLASMA GLUCOSE: 103 mg/dL

## 2017-07-02 LAB — ECHOCARDIOGRAM COMPLETE
Height: 69 in
Weight: 2947.2 oz

## 2017-07-02 LAB — POCT ACTIVATED CLOTTING TIME
ACTIVATED CLOTTING TIME: 373 s
Activated Clotting Time: 307 seconds

## 2017-07-02 LAB — HEPARIN LEVEL (UNFRACTIONATED): HEPARIN UNFRACTIONATED: 0.61 [IU]/mL (ref 0.30–0.70)

## 2017-07-02 SURGERY — LEFT HEART CATH AND CORONARY ANGIOGRAPHY
Anesthesia: LOCAL

## 2017-07-02 MED ORDER — LIDOCAINE HCL (PF) 1 % IJ SOLN
INTRAMUSCULAR | Status: AC
Start: 2017-07-02 — End: 2017-07-02
  Filled 2017-07-02: qty 30

## 2017-07-02 MED ORDER — TICAGRELOR 90 MG PO TABS
ORAL_TABLET | ORAL | Status: AC
  Filled 2017-07-02: qty 2

## 2017-07-02 MED ORDER — METOPROLOL SUCCINATE ER 25 MG PO TB24
12.5000 mg | ORAL_TABLET | Freq: Every day | ORAL | Status: DC
  Administered 2017-07-02 – 2017-07-03 (×2): 12.5 mg via ORAL
  Filled 2017-07-02 (×3): qty 1

## 2017-07-02 MED ORDER — TIROFIBAN HCL IN NACL 5-0.9 MG/100ML-% IV SOLN
INTRAVENOUS | Status: DC | PRN
Start: 2017-07-02 — End: 2017-07-02
  Administered 2017-07-02 (×2): 0.075 ug/kg/min via INTRAVENOUS

## 2017-07-02 MED ORDER — IOPAMIDOL (ISOVUE-370) INJECTION 76%
INTRAVENOUS | Status: AC
  Filled 2017-07-02: qty 100

## 2017-07-02 MED ORDER — MIDAZOLAM HCL 2 MG/2ML IJ SOLN
INTRAMUSCULAR | Status: DC | PRN
  Administered 2017-07-02 (×3): 1 mg via INTRAVENOUS

## 2017-07-02 MED ORDER — TIROFIBAN (AGGRASTAT) BOLUS VIA INFUSION
INTRAVENOUS | Status: DC | PRN
  Administered 2017-07-02: 2090 ug via INTRAVENOUS

## 2017-07-02 MED ORDER — SODIUM CHLORIDE 0.9% FLUSH
3.0000 mL | INTRAVENOUS | Status: DC | PRN
Start: 2017-07-02 — End: 2017-07-03

## 2017-07-02 MED ORDER — HEPARIN SODIUM (PORCINE) 5000 UNIT/ML IJ SOLN: 5000.0000 [IU] | Freq: Three times a day (TID) | INTRAMUSCULAR | Status: DC

## 2017-07-02 MED ORDER — ASPIRIN 81 MG PO CHEW: 81.0000 mg | CHEWABLE_TABLET | Freq: Every day | ORAL | Status: DC

## 2017-07-02 MED ORDER — IOPAMIDOL (ISOVUE-370) INJECTION 76%
INTRAVENOUS | Status: DC | PRN
  Administered 2017-07-02: 150 mL via INTRA_ARTERIAL

## 2017-07-02 MED ORDER — LABETALOL HCL 5 MG/ML IV SOLN: 10.0000 mg | INTRAVENOUS | Status: AC | PRN

## 2017-07-02 MED ORDER — HEPARIN SODIUM (PORCINE) 1000 UNIT/ML IJ SOLN
INTRAMUSCULAR | Status: AC
  Filled 2017-07-02: qty 1

## 2017-07-02 MED ORDER — ANGIOPLASTY BOOK
Freq: Once | Status: AC
  Administered 2017-07-02: 1
  Filled 2017-07-02: qty 1

## 2017-07-02 MED ORDER — TIROFIBAN HCL IN NACL 5-0.9 MG/100ML-% IV SOLN
0.0750 ug/kg/min | INTRAVENOUS | Status: AC
  Administered 2017-07-02: 23:00:00 0.075 ug/kg/min via INTRAVENOUS
  Filled 2017-07-02 (×3): qty 100

## 2017-07-02 MED ORDER — TICAGRELOR 90 MG PO TABS
ORAL_TABLET | ORAL | Status: DC | PRN
  Administered 2017-07-02: 180 mg via ORAL

## 2017-07-02 MED ORDER — TIROFIBAN HCL IN NACL 5-0.9 MG/100ML-% IV SOLN
INTRAVENOUS | Status: AC
  Filled 2017-07-02: qty 100

## 2017-07-02 MED ORDER — OXYCODONE HCL 5 MG PO TABS: 5.0000 mg | ORAL_TABLET | ORAL | Status: DC | PRN

## 2017-07-02 MED ORDER — TICAGRELOR 90 MG PO TABS
90.0000 mg | ORAL_TABLET | Freq: Two times a day (BID) | ORAL | Status: DC
  Administered 2017-07-02 – 2017-07-03 (×2): 90 mg via ORAL
  Filled 2017-07-02: qty 1

## 2017-07-02 MED ORDER — FENTANYL CITRATE (PF) 100 MCG/2ML IJ SOLN
INTRAMUSCULAR | Status: AC
  Filled 2017-07-02: qty 2

## 2017-07-02 MED ORDER — MIDAZOLAM HCL 2 MG/2ML IJ SOLN
INTRAMUSCULAR | Status: AC
  Filled 2017-07-02: qty 2

## 2017-07-02 MED ORDER — VERAPAMIL HCL 2.5 MG/ML IV SOLN
INTRAVENOUS | Status: DC | PRN
  Administered 2017-07-02: 10 mL via INTRA_ARTERIAL

## 2017-07-02 MED ORDER — SODIUM CHLORIDE 0.9% FLUSH: 3.0000 mL | Freq: Two times a day (BID) | INTRAVENOUS | Status: DC

## 2017-07-02 MED ORDER — ATORVASTATIN CALCIUM 80 MG PO TABS
80.0000 mg | ORAL_TABLET | Freq: Every day | ORAL | Status: DC
  Filled 2017-07-02: qty 1

## 2017-07-02 MED ORDER — HEART ATTACK BOUNCING BOOK
Freq: Once | Status: AC
  Administered 2017-07-02: 22:00:00 1
  Filled 2017-07-02: qty 1

## 2017-07-02 MED ORDER — FENTANYL CITRATE (PF) 100 MCG/2ML IJ SOLN
INTRAMUSCULAR | Status: DC | PRN
  Administered 2017-07-02 (×2): 50 ug via INTRAVENOUS

## 2017-07-02 MED ORDER — HEPARIN SODIUM (PORCINE) 1000 UNIT/ML IJ SOLN
INTRAMUSCULAR | Status: DC | PRN
  Administered 2017-07-02: 8000 [IU] via INTRAVENOUS
  Administered 2017-07-02: 4000 [IU] via INTRAVENOUS

## 2017-07-02 MED ORDER — ONDANSETRON HCL 4 MG/2ML IJ SOLN
4.0000 mg | Freq: Four times a day (QID) | INTRAMUSCULAR | Status: DC | PRN
Start: 2017-07-02 — End: 2017-07-02

## 2017-07-02 MED ORDER — ACTIVE PARTNERSHIP FOR HEALTH OF YOUR HEART BOOK
Freq: Once | Status: AC
  Administered 2017-07-02: 22:00:00 1
  Filled 2017-07-02: qty 1

## 2017-07-02 MED ORDER — HEPARIN (PORCINE) IN NACL 2-0.9 UNIT/ML-% IJ SOLN
INTRAMUSCULAR | Status: DC | PRN
  Administered 2017-07-02 (×2): 500 mL via INTRA_ARTERIAL

## 2017-07-02 MED ORDER — THE SENSUOUS HEART BOOK
Freq: Once | Status: AC
  Administered 2017-07-02: 1
  Filled 2017-07-02: qty 1

## 2017-07-02 MED ORDER — ACETAMINOPHEN 325 MG PO TABS: 650.0000 mg | ORAL_TABLET | ORAL | Status: DC | PRN

## 2017-07-02 MED ORDER — HYDRALAZINE HCL 20 MG/ML IJ SOLN
5.0000 mg | INTRAMUSCULAR | Status: AC | PRN
Start: 2017-07-02 — End: 2017-07-02

## 2017-07-02 MED ORDER — SODIUM CHLORIDE 0.9 % IV SOLN: 250.0000 mL | INTRAVENOUS | Status: DC | PRN

## 2017-07-02 MED ORDER — VERAPAMIL HCL 2.5 MG/ML IV SOLN
INTRAVENOUS | Status: AC
  Filled 2017-07-02: qty 2

## 2017-07-02 MED ORDER — HEPARIN (PORCINE) IN NACL 2-0.9 UNIT/ML-% IJ SOLN
INTRAMUSCULAR | Status: AC
  Filled 2017-07-02: qty 1000

## 2017-07-02 MED ORDER — LIDOCAINE HCL (PF) 1 % IJ SOLN
INTRAMUSCULAR | Status: DC | PRN
  Administered 2017-07-02: 2 mL via INTRADERMAL

## 2017-07-02 MED ORDER — SODIUM CHLORIDE 0.9 % WEIGHT BASED INFUSION
1.5000 mL/kg/h | INTRAVENOUS | Status: AC
  Administered 2017-07-02 (×2): 1.5 mL/kg/h via INTRAVENOUS

## 2017-07-02 SURGICAL SUPPLY — 19 items
BALLN SAPPHIRE 2.5X12 (BALLOONS) ×2
BALLN SAPPHIRE ~~LOC~~ 3.75X12 (BALLOONS) ×2 IMPLANT
BALLOON SAPPHIRE 2.5X12 (BALLOONS) ×1 IMPLANT
CATH INFINITI 5 FR JL3.5 (CATHETERS) ×2 IMPLANT
CATH INFINITI JR4 5F (CATHETERS) ×2 IMPLANT
CATH VISTA GUIDE 6FR XBLAD3.0 (CATHETERS) ×2 IMPLANT
COVER PRB 48X5XTLSCP FOLD TPE (BAG) ×1 IMPLANT
COVER PROBE 5X48 (BAG) ×1
DEVICE RAD COMP TR BAND LRG (VASCULAR PRODUCTS) ×2 IMPLANT
GLIDESHEATH SLEND A-KIT 6F 22G (SHEATH) ×2 IMPLANT
GUIDEWIRE INQWIRE 1.5J.035X260 (WIRE) ×1 IMPLANT
INQWIRE 1.5J .035X260CM (WIRE) ×2
KIT ENCORE 26 ADVANTAGE (KITS) ×2 IMPLANT
KIT HEART LEFT (KITS) ×2 IMPLANT
PACK CARDIAC CATHETERIZATION (CUSTOM PROCEDURE TRAY) ×2 IMPLANT
STENT SYNERGY DES 3.5X16 (Permanent Stent) ×2 IMPLANT
TRANSDUCER W/STOPCOCK (MISCELLANEOUS) ×2 IMPLANT
TUBING CIL FLEX 10 FLL-RA (TUBING) ×2 IMPLANT
WIRE ASAHI PROWATER 180CM (WIRE) ×2 IMPLANT

## 2017-07-02 NOTE — Progress Notes (Signed)
#   9.  S/W ALI @ [facility] # [id number]    BRILINTA 90 MG BID  COVER- YES  CO-PAY- $ 226.70   TIER- 3 DRUG  PRIOR APPROVAL- NO   NO DEDUCTIBLE   PREFERRED PHARMACY : [facility] AND [facility]

## 2017-07-02 NOTE — Progress Notes (Signed)
  Echocardiogram 2D Echocardiogram has been performed.  Roosvelt MaserLane, Hiilei Gerst F 07/02/2017, 4:02 PM

## 2017-07-02 NOTE — Plan of Care (Signed)
  Education: Knowledge of General Education information will improve 07/02/2017 0050 - Progressing by Elnita Maxwellodoo, Tamberly Pomplun A, RN   Activity: Risk for activity intolerance will decrease 07/02/2017 0050 - Progressing by Elnita Maxwellodoo, Tamula Morrical A, RN   Coping: Level of anxiety will decrease 07/02/2017 0050 - Progressing by Elnita Maxwellodoo, Sherryann Frese A, RN   Pain Managment: General experience of comfort will improve 07/02/2017 0050 - Progressing by Elnita Maxwellodoo, Lorenza Winkleman A, RN   Skin Integrity: Risk for impaired skin integrity will decrease 07/02/2017 0050 - Progressing by Elnita Maxwellodoo, Corleen Otwell A, RN

## 2017-07-02 NOTE — Progress Notes (Signed)
 Progress Note  Patient Name: Samuel Myers Date of Encounter: 07/02/2017  Primary Cardiologist: Mark Skains  Subjective   Had an episode of short-lived chest discomfort 1 week prior to admission.  Admitted over the weekend with 30-minute episodes of chest pain and eventual increase in troponin levels.  Inpatient Medications    Scheduled Meds: . aspirin EC  81 mg Oral Daily  . atorvastatin  40 mg Oral q1800  . diltiazem  60 mg Oral Q12H  . sodium chloride flush  3 mL Intravenous Q12H   Continuous Infusions: . sodium chloride    . sodium chloride 1 mL/kg/hr (07/02/17 0634)  . heparin 1,400 Units/hr (07/01/17 1824)   PRN Meds: sodium chloride, acetaminophen, nitroGLYCERIN, ondansetron (ZOFRAN) IV, sodium chloride flush   Vital Signs    Vitals:   07/01/17 1500 07/01/17 2014 07/01/17 2113 07/02/17 0622  BP: 111/67 109/62 110/77 119/71  Pulse: 62 64  (!) 52  Resp: 16 18  18  Temp: 98.2 F (36.8 C) 98.6 F (37 C)  97.9 F (36.6 C)  TempSrc: Oral Oral  Oral  SpO2: 98% 97%  98%  Weight:    184 lb 3.2 oz (83.6 kg)  Height:        Intake/Output Summary (Last 24 hours) at 07/02/2017 0841 Last data filed at 07/02/2017 0634 Gross per 24 hour  Intake 644.92 ml  Output 1300 ml  Net -655.08 ml   Filed Weights   07/01/17 0353 07/02/17 0622  Weight: 185 lb 3.2 oz (84 kg) 184 lb 3.2 oz (83.6 kg)    Telemetry    Normal sinus rhythm- Personally Reviewed  ECG    Performed on 07/01/2017 demonstrates ischemic anterior biphasic T wave abnormality.  Incomplete right bundle branch block is noted.  The changes are evolutionary compared with the admitting tracing on 06/30/2017- Personally Reviewed  Physical Exam  Young, no distress. GEN: No acute distress.   Neck: No JVD Cardiac: RRR, no murmurs, rubs, or gallops.  Respiratory: Clear to auscultation bilaterally. GI: Soft, nontender, non-distended  MS: No edema; No deformity. Neuro:  Nonfocal  Psych: Normal affect   Labs    Chemistry Recent Labs  Lab 06/30/17 1907 07/01/17 0407 07/02/17 0611  NA 137 139 139  K 3.2* 3.0* 3.9  CL 101 104 104  CO2 24 24 24  GLUCOSE 104* 116* 119*  BUN 9 7 9  CREATININE 1.05 0.95 0.89  CALCIUM 9.0 8.8* 9.0  GFRNONAA >60 >60 >60  GFRAA >60 >60 >60  ANIONGAP 12 11 11     Hematology Recent Labs  Lab 06/30/17 1907 07/01/17 0407 07/02/17 0611  WBC 8.9 9.9 7.1  RBC 5.05 4.78 4.87  HGB 14.9 14.0 14.0  HCT 42.8 40.3 41.5  MCV 84.8 84.3 85.2  MCH 29.5 29.3 28.7  MCHC 34.8 34.7 33.7  RDW 13.6 13.5 13.5  PLT 247 237 224    Cardiac Enzymes Recent Labs  Lab 07/01/17 0327 07/01/17 0905 07/01/17 1427  TROPONINI 2.78* 2.11* 1.55*    Recent Labs  Lab 06/30/17 1927  TROPIPOC 0.15*     BNP Recent Labs  Lab 07/01/17 0359  BNP 81.2     DDimer No results for input(s): DDIMER in the last 168 hours.   Radiology    Dg Chest 2 View  Result Date: 06/30/2017 CLINICAL DATA:  Chest pain for 2 days. Diaphoresis. Nausea and vomiting. Chills. EXAM: CHEST  2 VIEW COMPARISON:  None. FINDINGS: The heart size and mediastinal contours are within   normal limits. Both lungs are clear. The visualized skeletal structures are unremarkable. IMPRESSION: No active cardiopulmonary disease. Electronically Signed   By: John  Stahl M.D.   On: 06/30/2017 19:40   Ct Angio Chest/abd/pel For Dissection W And/or Wo Contrast  Result Date: 06/30/2017 CLINICAL DATA:  Central chest pain EXAM: CT ANGIOGRAPHY CHEST, ABDOMEN AND PELVIS TECHNIQUE: Multidetector CT imaging through the chest, abdomen and pelvis was performed using the standard protocol during bolus administration of intravenous contrast. Multiplanar reconstructed images and MIPs were obtained and reviewed to evaluate the vascular anatomy. CONTRAST:  100mL ISOVUE-370 IOPAMIDOL (ISOVUE-370) INJECTION 76% COMPARISON:  06/30/2017 radiograph FINDINGS: CTA CHEST FINDINGS Cardiovascular: The extreme lung apices were not included on the initial  arterial phase images and a subsequent set of images is submitted which includes the lung apices and upper mediastinum. Nonaneurysmal aorta. Non contrasted images demonstrate no intramural hematoma. No dissection is seen. Heart size is normal. No pericardial effusion Mediastinum/Nodes: No enlarged mediastinal, hilar, or axillary lymph nodes. Thyroid gland, trachea, and esophagus demonstrate no significant findings. Lungs/Pleura: Lungs are clear. No pleural effusion or pneumothorax. Musculoskeletal: No chest wall abnormality. No acute or significant osseous findings. Review of the MIP images confirms the above findings. CTA ABDOMEN AND PELVIS FINDINGS VASCULAR Aorta: Normal caliber aorta without aneurysm, dissection, vasculitis or significant stenosis. Celiac: Patent without evidence of aneurysm, dissection, vasculitis or significant stenosis. SMA: Patent without evidence of aneurysm, dissection, vasculitis or significant stenosis. Renals: Both renal arteries are patent without evidence of aneurysm, dissection, vasculitis, fibromuscular dysplasia or significant stenosis. IMA: Patent without evidence of aneurysm, dissection, vasculitis or significant stenosis. Inflow: Patent without evidence of aneurysm, dissection, vasculitis or significant stenosis. Minimal atherosclerotic calcification at the origin of right internal iliac. Veins: No obvious venous abnormality within the limitations of this arterial phase study. Review of the MIP images confirms the above findings. NON-VASCULAR Hepatobiliary: No focal liver abnormality is seen. No gallstones, gallbladder wall thickening, or biliary dilatation. Pancreas: Unremarkable. No pancreatic ductal dilatation or surrounding inflammatory changes. Spleen: Normal in size without focal abnormality. Adrenals/Urinary Tract: Adrenal glands are within normal limits. No hydronephrosis. 17 mm hypodensity in the mid right kidney with slight increased density values. Bladder normal  Stomach/Bowel: Stomach is within normal limits. Appendix appears normal. No evidence of bowel wall thickening, distention, or inflammatory changes. Minimal distal esophageal thickening. Lymphatic: No significantly enlarged lymph nodes Reproductive: Prostate is unremarkable. Other: Negative for free air or free fluid. Small fat in the umbilical region Musculoskeletal: No acute or significant osseous findings. Review of the MIP images confirms the above findings. IMPRESSION: 1. Negative for acute aortic dissection or aneurysm. 2. Clear lung fields. 3. No significant vascular stenosis within the abdomen or pelvis 4. 17 mm hypodense lesion mid right kidney, not compatible with simple cyst. When the patient is clinically stable and able to follow directions and hold their breath (preferably as an outpatient) further evaluation with dedicated abdominal MRI should be considered. 5. Minimal distal esophageal thickening, could be due to mild esophagitis or reflux. Electronically Signed   By: Kim  Fujinaga M.D.   On: 06/30/2017 22:48    Cardiac Studies   Will undergo coronary angiography  Patient Profile     36 y.o. male with untreated hypertension admitted with chest pain and troponin elevation with abnormal ECG.    Assessment & Plan    1.  Non-ST elevation acute coronary syndrome with ischemic appearing EKG and elevated cardiac markers. 2.  History of tobacco use 3.  Abnormal right   kidney on CT scan  Coronary angiography has been recommended to define anatomy and help guide therapy.  The patient was counseled to undergo left heart catheterization, coronary angiography, and possible percutaneous coronary intervention with stent implantation. The procedural risks and benefits were discussed in detail. The risks discussed included death, stroke, myocardial infarction, life-threatening bleeding, limb ischemia, kidney injury, allergy, and possible emergency cardiac surgery. The risk of these significant  complications were estimated to occur less than 1% of the time. After discussion, the patient has agreed to proceed.  For questions or updates, please contact [facility] Please consult [email or web address] for contact info under Cardiology/STEMI.      Signed, Turner Kunzman W Sanam Marmo III, MD  07/02/2017, 8:41 AM   

## 2017-07-02 NOTE — Interval H&P Note (Signed)
Cath Lab Visit (complete for each Cath Lab visit)  Clinical Evaluation Leading to the Procedure:   ACS: Yes.    Non-ACS:    Anginal Classification: CCS IV  Anti-ischemic medical therapy: Minimal Therapy (1 class of medications)  Non-Invasive Test Results: No non-invasive testing performed  Prior CABG: No previous CABG      History and Physical Interval Note:  07/02/2017 10:15 AM  Samuel Myers  has presented today for surgery, with the diagnosis of NSTEMI  The various methods of treatment have been discussed with the patient and family. After consideration of risks, benefits and other options for treatment, the patient has consented to  Procedure(s): LEFT HEART CATH AND CORONARY ANGIOGRAPHY (N/A) as a surgical intervention .  The patient's history has been reviewed, patient examined, no change in status, stable for surgery.  I have reviewed the patient's chart and labs.  Questions were answered to the patient's satisfaction.     Lyn RecordsHenry W Smith III

## 2017-07-02 NOTE — Care Management Note (Signed)
Case Management Note  Patient Details  Name: Waunita SchoonerShannon Ostrow MRN: [id number] Date of Birth: 06/23/1980  Subjective/Objective:  From home, pta indep, s/p stent intervention, NCM gave patient the 30 day savings card and $5 co pay card,  Informed patient to use $5 co pay card when he gets down to a week  Supply of medications if he has any problems with copay he will know before he runs out and let his cardiologist know.  [facility], he will need a printed script.                    Action/Plan: DC home when medically ready.   Expected Discharge Date:                  Expected Discharge Plan:  Home/Self Care  In-House Referral:     Discharge planning Services  CM Consult  Post Acute Care Choice:    Choice offered to:     DME Arranged:    DME Agency:     HH Arranged:    HH Agency:     Status of Service:  Completed, signed off  If discussed at [employer] Length [employer] Stay Meetings, dates discussed:    Additional Comments:  Leone Havenaylor, Hajra [place], RN 07/02/2017, 5:39 PM

## 2017-07-02 NOTE — H&P (View-Only) (Signed)
Progress Note  Patient Name: Samuel Myers Date of Encounter: 07/02/2017  Primary Cardiologist: Donato SchultzMark Skains  Subjective   Had an episode of short-lived chest discomfort 1 week prior to admission.  Admitted over the weekend with 30-minute episodes of chest pain and eventual increase in troponin levels.  Inpatient Medications    Scheduled Meds: . aspirin EC  81 mg Oral Daily  . atorvastatin  40 mg Oral q1800  . diltiazem  60 mg Oral Q12H  . sodium chloride flush  3 mL Intravenous Q12H   Continuous Infusions: . sodium chloride    . sodium chloride 1 mL/kg/hr (07/02/17 0634)  . heparin 1,400 Units/hr (07/01/17 1824)   PRN Meds: sodium chloride, acetaminophen, nitroGLYCERIN, ondansetron (ZOFRAN) IV, sodium chloride flush   Vital Signs    Vitals:   07/01/17 1500 07/01/17 2014 07/01/17 2113 07/02/17 0622  BP: 111/67 109/62 110/77 119/71  Pulse: 62 64  (!) 52  Resp: 16 18  18   Temp: 98.2 F (36.8 C) 98.6 F (37 C)  97.9 F (36.6 C)  TempSrc: Oral Oral  Oral  SpO2: 98% 97%  98%  Weight:    184 lb 3.2 oz (83.6 kg)  Height:        Intake/Output Summary (Last 24 hours) at 07/02/2017 0841 Last data filed at 07/02/2017 [id number] Gross per 24 hour  Intake 644.92 ml  Output 1300 ml  Net -655.08 ml   Filed Weights   07/01/17 0353 07/02/17 0622  Weight: 185 lb 3.2 oz (84 kg) 184 lb 3.2 oz (83.6 kg)    Telemetry    Normal sinus rhythm- Personally Reviewed  ECG    Performed on 07/01/2017 demonstrates ischemic anterior biphasic T wave abnormality.  Incomplete right bundle branch block is noted.  The changes are evolutionary compared with the admitting tracing on 06/30/2017- Personally Reviewed  Physical Exam  Young, no distress. GEN: No acute distress.   Neck: No JVD Cardiac: RRR, no murmurs, rubs, or gallops.  Respiratory: Clear to auscultation bilaterally. GI: Soft, nontender, non-distended  MS: No edema; No deformity. Neuro:  Nonfocal  Psych: Normal affect   Labs    Chemistry Recent Labs  Lab 06/30/17 1907 07/01/17 0407 07/02/17 0611  NA 137 139 139  K 3.2* 3.0* 3.9  CL 101 104 104  CO2 24 24 24   GLUCOSE 104* 116* 119*  BUN 9 7 9   CREATININE 1.05 0.95 0.89  CALCIUM 9.0 8.8* 9.0  GFRNONAA >60 >60 >60  GFRAA >60 >60 >60  ANIONGAP 12 11 11      Hematology Recent Labs  Lab 06/30/17 1907 07/01/17 0407 07/02/17 0611  WBC 8.9 9.9 7.1  RBC 5.05 4.78 4.87  HGB 14.9 14.0 14.0  HCT 42.8 40.3 41.5  MCV 84.8 84.3 85.2  MCH 29.5 29.3 28.7  MCHC 34.8 34.7 33.7  RDW 13.6 13.5 13.5  PLT 247 237 224    Cardiac Enzymes Recent Labs  Lab 07/01/17 0327 07/01/17 0905 07/01/17 1427  TROPONINI 2.78* 2.11* 1.55*    Recent Labs  Lab 06/30/17 1927  TROPIPOC 0.15*     BNP Recent Labs  Lab 07/01/17 0359  BNP 81.2     DDimer No results for input(s): DDIMER in the last 168 hours.   Radiology    Dg Chest 2 View  Result Date: 06/30/2017 CLINICAL DATA:  Chest pain for 2 days. Diaphoresis. Nausea and vomiting. Chills. EXAM: CHEST  2 VIEW COMPARISON:  None. FINDINGS: The heart size and mediastinal contours are within  normal limits. Both lungs are clear. The visualized skeletal structures are unremarkable. IMPRESSION: No active cardiopulmonary disease. Electronically Signed   By: Myles Rosenthal M.D.   On: 06/30/2017 19:40   Ct Angio Chest/abd/pel For Dissection W And/or Wo Contrast  Result Date: 06/30/2017 CLINICAL DATA:  Central chest pain EXAM: CT ANGIOGRAPHY CHEST, ABDOMEN AND PELVIS TECHNIQUE: Multidetector CT imaging through the chest, abdomen and pelvis was performed using the standard protocol during bolus administration of intravenous contrast. Multiplanar reconstructed images and MIPs were obtained and reviewed to evaluate the vascular anatomy. CONTRAST:  ISOVUE-370 IOPAMIDOL (ISOVUE-370) INJECTION 76% COMPARISON:  06/30/2017 radiograph FINDINGS: CTA CHEST FINDINGS Cardiovascular: The extreme lung apices were not included on the initial  arterial phase images and a subsequent set of images is submitted which includes the lung apices and upper mediastinum. Nonaneurysmal aorta. Non contrasted images demonstrate no intramural hematoma. No dissection is seen. Heart size is normal. No pericardial effusion Mediastinum/Nodes: No enlarged mediastinal, hilar, or axillary lymph nodes. Thyroid gland, trachea, and esophagus demonstrate no significant findings. Lungs/Pleura: Lungs are clear. No pleural effusion or pneumothorax. Musculoskeletal: No chest wall abnormality. No acute or significant osseous findings. Review of the MIP images confirms the above findings. CTA ABDOMEN AND PELVIS FINDINGS VASCULAR Aorta: Normal caliber aorta without aneurysm, dissection, vasculitis or significant stenosis. Celiac: Patent without evidence of aneurysm, dissection, vasculitis or significant stenosis. SMA: Patent without evidence of aneurysm, dissection, vasculitis or significant stenosis. Renals: Both renal arteries are patent without evidence of aneurysm, dissection, vasculitis, fibromuscular dysplasia or significant stenosis. IMA: Patent without evidence of aneurysm, dissection, vasculitis or significant stenosis. Inflow: Patent without evidence of aneurysm, dissection, vasculitis or significant stenosis. Minimal atherosclerotic calcification at the origin of right internal iliac. Veins: No obvious venous abnormality within the limitations of this arterial phase study. Review of the MIP images confirms the above findings. NON-VASCULAR Hepatobiliary: No focal liver abnormality is seen. No gallstones, gallbladder wall thickening, or biliary dilatation. Pancreas: Unremarkable. No pancreatic ductal dilatation or surrounding inflammatory changes. Spleen: Normal in size without focal abnormality. Adrenals/Urinary Tract: Adrenal glands are within normal limits. No hydronephrosis. 17 mm hypodensity in the mid right kidney with slight increased density values. Bladder normal  Stomach/Bowel: Stomach is within normal limits. Appendix appears normal. No evidence of bowel wall thickening, distention, or inflammatory changes. Minimal distal esophageal thickening. Lymphatic: No significantly enlarged lymph nodes Reproductive: Prostate is unremarkable. Other: Negative for free air or free fluid. Small fat in the umbilical region Musculoskeletal: No acute or significant osseous findings. Review of the MIP images confirms the above findings. IMPRESSION: 1. Negative for acute aortic dissection or aneurysm. 2. Clear lung fields. 3. No significant vascular stenosis within the abdomen or pelvis 4. 17 mm hypodense lesion mid right kidney, not compatible with simple cyst. When the patient is clinically stable and able to follow directions and hold their breath (preferably as an outpatient) further evaluation with dedicated abdominal MRI should be considered. 5. Minimal distal esophageal thickening, could be due to mild esophagitis or reflux. Electronically Signed   By: Jasmine Pang M.D.   On: 06/30/2017 22:48    Cardiac Studies   Will undergo coronary angiography  Patient Profile     37 y.o. male with untreated hypertension admitted with chest pain and troponin elevation with abnormal ECG.    Assessment & Plan    1.  Non-ST elevation acute coronary syndrome with ischemic appearing EKG and elevated cardiac markers. 2.  History of tobacco use 3.  Abnormal right  kidney on CT scan  Coronary angiography has been recommended to define anatomy and help guide therapy.  The patient was counseled to undergo left heart catheterization, coronary angiography, and possible percutaneous coronary intervention with stent implantation. The procedural risks and benefits were discussed in detail. The risks discussed included death, stroke, myocardial infarction, life-threatening bleeding, limb ischemia, kidney injury, allergy, and possible emergency cardiac surgery. The risk of these significant  complications were estimated to occur less than 1% of the time. After discussion, the patient has agreed to proceed.  For questions or updates, please contact [facility] Please consult [email or web address] for contact info under Cardiology/STEMI.      Signed, Lesleigh Noe, MD  07/02/2017, 8:41 AM

## 2017-07-02 NOTE — Progress Notes (Signed)
TR BAND REMOVAL  LOCATION:    right radial  DEFLATED PER PROTOCOL:    Yes.    TIME BAND OFF / DRESSING APPLIED:    1615   SITE UPON ARRIVAL:    Level 0  SITE AFTER BAND REMOVAL:    Level 0  CIRCULATION SENSATION AND MOVEMENT:    Within Normal Limits   Yes.    COMMENTS:   Rechecked during shift with no change in assessment

## 2017-07-03 ENCOUNTER — Encounter (HOSPITAL_COMMUNITY): Payer: Self-pay | Admitting: General Practice

## 2017-07-03 LAB — CBC
HEMATOCRIT: 40.8 % (ref 39.0–52.0)
HEMOGLOBIN: 14.1 g/dL (ref 13.0–17.0)
MCH: 29 pg (ref 26.0–34.0)
MCHC: 34.6 g/dL (ref 30.0–36.0)
MCV: 84 fL (ref 78.0–100.0)
Platelets: 210 10*3/uL (ref 150–400)
RBC: 4.86 MIL/uL (ref 4.22–5.81)
RDW: 13.4 % (ref 11.5–15.5)
WBC: 7.6 10*3/uL (ref 4.0–10.5)

## 2017-07-03 LAB — BASIC METABOLIC PANEL
Anion gap: 10 (ref 5–15)
BUN: 6 mg/dL (ref 6–20)
CHLORIDE: 107 mmol/L (ref 101–111)
CO2: 22 mmol/L (ref 22–32)
Calcium: 9 mg/dL (ref 8.9–10.3)
Creatinine, Ser: 0.81 mg/dL (ref 0.61–1.24)
GFR calc Af Amer: >60 mL/min (ref >60)
GFR calc non Af Amer: >60 mL/min (ref >60)
GLUCOSE: 103 mg/dL — AB (ref 65–99)
POTASSIUM: 3.8 mmol/L (ref 3.5–5.1)
Sodium: 139 mmol/L (ref 135–145)

## 2017-07-03 MED ORDER — TICAGRELOR 90 MG PO TABS: 90.0000 mg | ORAL_TABLET | Freq: Two times a day (BID) | ORAL | 11 refills | Status: AC

## 2017-07-03 MED ORDER — LISINOPRIL 10 MG PO TABS
10.0000 mg | ORAL_TABLET | Freq: Every day | ORAL | Status: DC
  Administered 2017-07-03: 10:00:00 10 mg via ORAL
  Filled 2017-07-03: qty 1

## 2017-07-03 MED ORDER — ATORVASTATIN CALCIUM 80 MG PO TABS: 80.0000 mg | ORAL_TABLET | Freq: Every day | ORAL | 1 refills | Status: AC

## 2017-07-03 MED ORDER — METOPROLOL SUCCINATE ER 25 MG PO TB24: 12.5000 mg | ORAL_TABLET | Freq: Every day | ORAL | 1 refills | Status: AC

## 2017-07-03 MED ORDER — LISINOPRIL 10 MG PO TABS: 10.0000 mg | ORAL_TABLET | Freq: Every day | ORAL | 1 refills | Status: AC

## 2017-07-03 MED ORDER — NITROGLYCERIN 0.4 MG SL SUBL: 0.4000 mg | SUBLINGUAL_TABLET | SUBLINGUAL | 5 refills | Status: AC | PRN

## 2017-07-03 MED ORDER — ASPIRIN 81 MG PO TBEC: 81.0000 mg | DELAYED_RELEASE_TABLET | Freq: Every day | ORAL | 3 refills | Status: AC

## 2017-07-03 MED ORDER — TICAGRELOR 90 MG PO TABS: 90.0000 mg | ORAL_TABLET | Freq: Two times a day (BID) | ORAL | 0 refills | Status: DC

## 2017-07-03 MED FILL — Heparin Sodium (Porcine) 2 Unit/ML in Sodium Chloride 0.9%: INTRAMUSCULAR | Qty: 1000 | Status: AC

## 2017-07-03 NOTE — Discharge Summary (Addendum)
The patient has been seen in conjunction with Daune Perch, NP-C. All aspects of care have been considered and discussed. The patient has been personally interviewed, examined, and all clinical data has been reviewed.   Non-ST elevation acute coronary syndrome determined to be related to high-grade mid LAD stenosis which was treated with drug-eluting stent.  For the distal region of hazy 50% narrowing felt to be a combination of thrombus plus plaque was dilated with balloon but not stented.  Physical exam is unremarkable.  Has ambulated without chest discomfort.  Has markedly abnormal appearing EKG with diffuse symmetrical T wave inversion across the precordium.  Plan is for discharge, smoking cessation, high intensity statin therapy, low-dose ACE inhibitor therapy and beta-blocker.  Transition of care appointment at the [facility] in [facility] within the next 7-14 days.  Should not return to work for at least 7 days.  Discharge Summary    Patient ID: Samuel Myers,  MRN: [id number], DOB/AGE: 23-Jun-1980 37 y.o.  Admit date: 06/30/2017 Discharge date: 07/03/2017  Primary Care Provider: Patient, No Pcp Per Primary Cardiologist: No primary care provider on file.  Discharge Diagnoses    Principal Problem:   NSTEMI (non-ST elevated myocardial infarction) [facility]) Active Problems:   Chest pain   Essential hypertension   Hypokalemia   Abnormal ECG   Allergies Allergies  Allergen Reactions  . Hydrocodone Hives    Diagnostic Studies/Procedures    Cardiac catheterization and PCI results 07/02/17: Conclusion   Non-ST elevation acute coronary syndrome involving the mid LAD high-grade mid stenosis followed by a lucent area of narrowing that likely represents distal embolus from the culprit lesion.  Successful PCI and stenting of the mid LAD from 95-99% down to less than 10% with TIMI grade III flow with final balloon diameter 3.75 mm. The further  distal stenosis was dilated with the 2.5 x 12 mm balloon to 3 atm and demonstrated improved appearance with still evidence of thrombus.  Left main is normal  Circumflex contains no significant obstruction  Right coronary dominant without any significant obstruction  LV gram demonstrates mid to apical hypokinesis. EF 50-55%.  RECOMMENDATIONS:   Aggrastat times 18 hours  Aspirin and Brilinta times 12 months.  High intensity statin therapy.  Low-dose beta-blocker therapy.  Potential discharge in a.m.    Diagnostic Diagram       Post-Intervention Diagram        _____________  Echocardiogram 07/02/17 Study Conclusions  - Left ventricle: The cavity size was normal. Wall thickness was normal. Systolic function was normal. The estimated ejection fraction was in the range of 50% to 55%.   History of Present Illness     Samuel Myers is a 37 y.o. male with a history of untreated HTN who presents with chest pain and positive troponin.   The patient has no medical history except for untreated HTN and active smoking. He reports that he has been experiencing intermittent chest pain including an episode several weeks ago and multiple episodes today. He states that the pain comes without trigger and resolves without any particular intervention.  He is visiting the area from [facility] and presented to the [facility] ED earlier today following an episode of severe chest pain with nausea and diaphoresis. He underwent ECG that showed incomplete RBBB and some ST elevation in anterior precordial leads. He was going to be sent back to the waiting room and reportedly did not want to wait for an ED bed and left prior  to evaluation. After leaving the [facility] ED, he had another episode of chest pain, and he came to the [facility] ED.  In the ED, the patient was chest pain free. He had HR 50-70s, SBP 110-160s. ECG re-demonstrated incomplete RBBB but did not show any ST elevations. He  underwent CTA that showed no PE or any other acute process. Troponin resulted at 0.15. He was started on a heparin infusion, and admission to cardiology was requested.   Hospital Course     Consultants: None  Troponin peaked at 2.78 and trended down to 1.55. BNP was 81.2. The patient was taken for cardiac cath on 07/02/17. See report above. The patient was observed overnight and has done well. The right radial cath site has not been a problem. He has had no recurrent chest pain.   Non-ST elevation anterior myocardial infarction due to high-grade obstruction in the mid LAD treated with DES.  Further distal region of thrombus within the mid LAD treated with angioplasty with improved lumen.  Major risk factor is smoking.  Despite normal LDL on admission we will you signed statin therapy for at least 30 days and can be reduced to moderate dose.  Hemoglobin A1c was adequate.  Smoking cessation strongly encouraged. Plan for Brilinta and aspirin for 1 year. The patient wishes to follow up with [facility] in [place]. He will need a transition of care appointment in the next 1-2 weeks. I have called that office and requested follow up. They request records be sent and they will call the patient to arrange appointment.   [name] should not return to work for at least 1 week. I will provided a work note. He will be provided with a prescription for SL NTG in case of recurrent chest discomfort.  He has been advised on smoking cessation.   Hgb 14.1 and SCr 0.81 at discharge.   Patient has been seen by Dr. Tamala Julian today and deemed ready for discharge home. All follow up appointments have been scheduled. Discharge medications are listed below.  Prescriptions were printed out for pt to take to a pharmacy of his choice.   _____________  Discharge Vitals Blood pressure (!) 133/99, pulse 64, temperature 98.2 F (36.8 C), temperature source Oral, resp. rate 13, height 5' 9" (1.753 m), weight 185 lb  3 oz (84 kg), SpO2 97 %.  Filed Weights   07/01/17 0353 07/02/17 0622 07/03/17 0342  Weight: 185 lb 3.2 oz (84 kg) 184 lb 3.2 oz (83.6 kg) 185 lb 3 oz (84 kg)    Labs & Radiologic Studies    CBC Recent Labs    07/02/17 0611 07/03/17 0333  WBC 7.1 7.6  HGB 14.0 14.1  HCT 41.5 40.8  MCV 85.2 84.0  PLT 224 494   Basic Metabolic Panel Recent Labs    07/02/17 0611 07/03/17 0333  NA 139 139  K 3.9 3.8  CL 104 107  CO2 24 22  GLUCOSE 119* 103*  BUN 9 6  CREATININE 0.89 0.81  CALCIUM 9.0 9.0   Liver Function Tests No results for input(s): AST, ALT, ALKPHOS, BILITOT, PROT, ALBUMIN in the last 72 hours. No results for input(s): LIPASE, AMYLASE in the last 72 hours. Cardiac Enzymes Recent Labs    07/01/17 0327 07/01/17 0905 07/01/17 1427  TROPONINI 2.78* 2.11* 1.55*   BNP Invalid input(s): POCBNP D-Dimer No results for input(s): DDIMER in the last 72 hours. Hemoglobin A1C Recent Labs    07/01/17 0359  HGBA1C  5.2   Fasting Lipid Panel Recent Labs    07/01/17 0407  CHOL 177  HDL 26*  LDLCALC 77  TRIG 371*  CHOLHDL 6.8   Thyroid Function Tests No results for input(s): TSH, T4TOTAL, T3FREE, THYROIDAB in the last 72 hours.  Invalid input(s): FREET3 _____________  Dg Chest 2 View  Result Date: 06/30/2017 CLINICAL DATA:  Chest pain for 2 days. Diaphoresis. Nausea and vomiting. Chills. EXAM: CHEST  2 VIEW COMPARISON:  None. FINDINGS: The heart size and mediastinal contours are within normal limits. Both lungs are clear. The visualized skeletal structures are unremarkable. IMPRESSION: No active cardiopulmonary disease. Electronically Signed   By: Earle Gell M.D.   On: 06/30/2017 19:40   Ct Angio Chest/abd/pel For Dissection W And/or Wo Contrast  Result Date: 06/30/2017 CLINICAL DATA:  Central chest pain EXAM: CT ANGIOGRAPHY CHEST, ABDOMEN AND PELVIS TECHNIQUE: Multidetector CT imaging through the chest, abdomen and pelvis was performed using the standard  protocol during bolus administration of intravenous contrast. Multiplanar reconstructed images and MIPs were obtained and reviewed to evaluate the vascular anatomy. CONTRAST:  1107m ISOVUE-370 IOPAMIDOL (ISOVUE-370) INJECTION 76% COMPARISON:  06/30/2017 radiograph FINDINGS: CTA CHEST FINDINGS Cardiovascular: The extreme lung apices were not included on the initial arterial phase images and a subsequent set of images is submitted which includes the lung apices and upper mediastinum. Nonaneurysmal aorta. Non contrasted images demonstrate no intramural hematoma. No dissection is seen. Heart size is normal. No pericardial effusion Mediastinum/Nodes: No enlarged mediastinal, hilar, or axillary lymph nodes. Thyroid gland, trachea, and esophagus demonstrate no significant findings. Lungs/Pleura: Lungs are clear. No pleural effusion or pneumothorax. Musculoskeletal: No chest wall abnormality. No acute or significant osseous findings. Review of the MIP images confirms the above findings. CTA ABDOMEN AND PELVIS FINDINGS VASCULAR Aorta: Normal caliber aorta without aneurysm, dissection, vasculitis or significant stenosis. Celiac: Patent without evidence of aneurysm, dissection, vasculitis or significant stenosis. SMA: Patent without evidence of aneurysm, dissection, vasculitis or significant stenosis. Renals: Both renal arteries are patent without evidence of aneurysm, dissection, vasculitis, fibromuscular dysplasia or significant stenosis. IMA: Patent without evidence of aneurysm, dissection, vasculitis or significant stenosis. Inflow: Patent without evidence of aneurysm, dissection, vasculitis or significant stenosis. Minimal atherosclerotic calcification at the origin of right internal iliac. Veins: No obvious venous abnormality within the limitations of this arterial phase study. Review of the MIP images confirms the above findings. NON-VASCULAR Hepatobiliary: No focal liver abnormality is seen. No gallstones, gallbladder  wall thickening, or biliary dilatation. Pancreas: Unremarkable. No pancreatic ductal dilatation or surrounding inflammatory changes. Spleen: Normal in size without focal abnormality. Adrenals/Urinary Tract: Adrenal glands are within normal limits. No hydronephrosis. 17 mm hypodensity in the mid right kidney with slight increased density values. Bladder normal Stomach/Bowel: Stomach is within normal limits. Appendix appears normal. No evidence of bowel wall thickening, distention, or inflammatory changes. Minimal distal esophageal thickening. Lymphatic: No significantly enlarged lymph nodes Reproductive: Prostate is unremarkable. Other: Negative for free air or free fluid. Small fat in the umbilical region Musculoskeletal: No acute or significant osseous findings. Review of the MIP images confirms the above findings. IMPRESSION: 1. Negative for acute aortic dissection or aneurysm. 2. Clear lung fields. 3. No significant vascular stenosis within the abdomen or pelvis 4. 17 mm hypodense lesion mid right kidney, not compatible with simple cyst. When the patient is clinically stable and able to follow directions and hold their breath (preferably as an outpatient) further evaluation with dedicated abdominal MRI should be considered. 5. Minimal distal esophageal thickening,  could be due to mild esophagitis or reflux. Electronically Signed   By: Donavan Foil M.D.   On: 06/30/2017 22:48   Disposition   Pt is being discharged home today in good condition.  Follow-up Plans & Appointments    Follow-up Information    primary doctor Follow up.   Contact information: call number to get one to get a follow up MRI for kidney lesion       [facility] Follow up.   Why:  Your records have been sent to their office and an appointment requested. They will call you to schedule a follow up. Please call them if you do not get a call in a few days. [phone]         Discharge Instructions    Amb  Referral to Cardiac Rehabilitation   Complete by:  As directed    To [place] [facility] in    Diagnosis:   Coronary Stents NSTEMI PTCA     Diet - low sodium heart healthy   Complete by:  As directed    Discharge instructions   Complete by:  As directed    PLEASE REMEMBER TO BRING ALL OF [place].  PLEASE ATTEND ALL SCHEDULED FOLLOW-UP APPOINTMENTS.   Activity: Increase activity slowly as tolerated. You may shower, but no soaking baths (or swimming) for 1 week. No driving for 24 hours. No lifting over 5 lbs for 1 week. No sexual activity for 1 week.   You May Return to Work: in 1 week (if applicable)  Wound Care: You may wash cath site gently with soap and water. Keep cath site clean and dry. If you notice pain, swelling, bleeding or pus at your cath site, please call [phone].   Increase activity slowly   Complete by:  As directed       Discharge Medications   Allergies as of 07/03/2017      Reactions   Hydrocodone Hives      Medication List    TAKE these medications   aspirin 81 MG EC tablet Take 1 tablet (81 mg total) by mouth daily. Start taking on:  07/04/2017   atorvastatin 80 MG tablet Commonly known as:  LIPITOR Take 1 tablet (80 mg total) by mouth daily at 6 PM.   lisinopril 10 MG tablet Commonly known as:  PRINIVIL,ZESTRIL Take 1 tablet (10 mg total) by mouth daily. Start taking on:  07/04/2017   metoprolol succinate 25 MG 24 hr tablet Commonly known as:  TOPROL-XL Take 0.5 tablets (12.5 mg total) by mouth daily. Start taking on:  07/04/2017   nitroGLYCERIN 0.4 MG SL tablet Commonly known as:  NITROSTAT Place 1 tablet (0.4 mg total) under the tongue every 5 (five) minutes x 3 doses as needed for chest pain.   ticagrelor 90 MG Tabs tablet Commonly known as:  BRILINTA Take 1 tablet (90 mg total) by mouth 2 (two) times daily.        Aspirin prescribed at discharge?  Yes High Intensity Statin Prescribed?  (Lipitor 40-87m or Crestor 20-491m: Yes Beta Blocker Prescribed? Yes For EF <40%, was ACEI/ARB Prescribed? Yes ADP Receptor Inhibitor Prescribed? (i.e. Plavix etc.-Includes Medically Managed Patients): Yes For EF <40%, Aldosterone Inhibitor Prescribed? No: normal EF Was EF assessed during THIS hospitalization? Yes Was Cardiac Rehab II ordered? (Included Medically managed Patients): Yes   Outstanding Labs/Studies   None  Duration of Discharge Encounter   Greater than 30 minutes including physician  time.  Signed, Daune Perch NP 07/03/2017, 10:37 AM

## 2017-07-03 NOTE — Progress Notes (Signed)
Progress Note  Patient Name: Samuel Myers Date of Encounter: 07/03/2017  Primary Cardiologist: No primary care provider on file.   Subjective   No recurrence of chest discomfort.  The right radial cath site has not been a problem.  Inpatient Medications    Scheduled Meds: . aspirin EC  81 mg Oral Daily  . atorvastatin  80 mg Oral q1800  . heparin  5,000 Units Subcutaneous Q8H  . metoprolol succinate  12.5 mg Oral Daily  . sodium chloride flush  3 mL Intravenous Q12H  . ticagrelor  90 mg Oral BID   Continuous Infusions: . sodium chloride     PRN Meds: sodium chloride, acetaminophen, nitroGLYCERIN, ondansetron (ZOFRAN) IV, oxyCODONE, sodium chloride flush   Vital Signs    Vitals:   07/02/17 2000 07/02/17 2003 07/03/17 0342 07/03/17 0830  BP: (!) 136/94 132/85 129/88 (!) 133/99  Pulse: (!) 59 62 62 64  Resp: 15 17 13    Temp:   97.8 F (36.6 C) 98.2 F (36.8 C)  TempSrc:   Oral Oral  SpO2: 96% 98% 98% 97%  Weight:   185 lb 3 oz (84 kg)   Height:        Intake/Output Summary (Last 24 hours) at 07/03/2017 0924 Last data filed at 07/03/2017 0500 Gross per 24 hour  Intake 1843.82 ml  Output 1100 ml  Net 743.82 ml   Filed Weights   07/01/17 0353 07/02/17 0622 07/03/17 0342  Weight: 185 lb 3.2 oz (84 kg) 184 lb 3.2 oz (83.6 kg) 185 lb 3 oz (84 kg)    Telemetry    Normal sinus rhythm and sinus bradycardia- Personally Reviewed  ECG    Sinus rhythm with marked precordial repolarization abnormality with diffuse symmetrical T wave inversion I, aVL, and V1 through V6.  Depth of T wave inversion is improved compared with postprocedure.- Personally Reviewed  Physical Exam  Feels well and has no recurrence of chest discomfort. GEN: No acute distress.   Neck: No JVD Cardiac: RRR, no murmurs, rubs, or gallops.  The radial cath site is unremarkable. Respiratory: Clear to auscultation bilaterally. GI: Soft, nontender, non-distended  MS: No edema; No deformity. Neuro:   Nonfocal  Psych: Normal affect   Labs    Chemistry Recent Labs  Lab 07/01/17 0407 07/02/17 0611 07/03/17 0333  NA 139 139 139  K 3.0* 3.9 3.8  CL 104 104 107  CO2 24 24 22   GLUCOSE 116* 119* 103*  BUN 7 9 6   CREATININE 0.95 0.89 0.81  CALCIUM 8.8* 9.0 9.0  GFRNONAA >60 >60 >60  GFRAA >60 >60 >60  ANIONGAP 11 11 10      Hematology Recent Labs  Lab 07/01/17 0407 07/02/17 0611 07/03/17 0333  WBC 9.9 7.1 7.6  RBC 4.78 4.87 4.86  HGB 14.0 14.0 14.1  HCT 40.3 41.5 40.8  MCV 84.3 85.2 84.0  MCH 29.3 28.7 29.0  MCHC 34.7 33.7 34.6  RDW 13.5 13.5 13.4  PLT 237 224 210    Cardiac Enzymes Recent Labs  Lab 07/01/17 0327 07/01/17 0905 07/01/17 1427  TROPONINI 2.78* 2.11* 1.55*    Recent Labs  Lab 06/30/17 1927  TROPIPOC 0.15*     BNP Recent Labs  Lab 07/01/17 0359  BNP 81.2    Hemoglobin A1c on admission 5.2, LDL 77.  DDimer No results for input(s): DDIMER in the last 168 hours.   Radiology    No results found.  Cardiac Studies   Cardiac catheterization and PCI results  07/02/17: Conclusion    Non-ST elevation acute coronary syndrome involving the mid LAD high-grade mid stenosis followed by a lucent area of narrowing that likely represents distal embolus from the culprit lesion.  Successful PCI and stenting of the mid LAD from 95-99% down to less than 10% with TIMI grade III flow with final balloon diameter 3.75 mm.  The further distal stenosis was dilated with the 2.5 x 12 mm balloon to 3 atm and demonstrated improved appearance with still evidence of thrombus.  Left main is normal  Circumflex contains no significant obstruction  Right coronary dominant without any significant obstruction  LV gram demonstrates mid to apical hypokinesis.  EF 50-55%.   RECOMMENDATIONS:    Aggrastat times 18 hours  Aspirin and Brilinta times 12 months.  High intensity statin therapy.  Low-dose beta-blocker therapy.  Potential discharge in a.m.    Coronary Diagrams   Diagnostic Diagram       Post-Intervention Diagram           Patient Profile     37 y.o. male unstable angina pectoris/non-ST elevation myocardial infarction.  Risk factors include tobacco use.  Assessment & Plan    1. Non-ST elevation anterior myocardial infarction due to high-grade obstruction in the mid LAD treated with DES.  Further distal region of thrombus within the mid LAD treated with angioplasty with improved lumen.  Major risk factor is smoking.  Despite normal LDL on admission we will you signed statin therapy for at least 30 days and can be reduced to moderate dose.  Hemoglobin A1c was adequate.  Smoking cessation strongly encouraged.  Plan discharge today if ambulates without difficulty.  Brilinta and aspirin for 1 year.  Prior to discharge we need to arrange a cardiology appointment at [facility] in [place] for transition of care.  Should not return to work for at least 1 week.  Needs nitroglycerin in case recurrent chest discomfort.  For questions or updates, please contact [facility] Please consult [email or web address] for contact info under Cardiology/STEMI.      Signed, Sinclair Grooms, MD  07/03/2017, 9:24 AM

## 2017-07-03 NOTE — Progress Notes (Signed)
CARDIAC REHAB PHASE I   PRE:  Rate/Rhythm: 64 SR    BP: sitting 133/99    SaO2:   MODE:  Ambulation: 1000 ft   POST:  Rate/Rhythm: 88 SR    BP: sitting 142/102     SaO2:   Tolerated well, no c/o. BP elevated. Pt is in shock. He thought he took good care of himself. Long discussion with pt and s/o of risk factor reduction. Understands need for Brilinta and other meds, smoking cessation, diet change (drinks soft drinks), ex, NTG, and CRPII. Voiced understanding, is planning to quit smoking. Gave his partner fake cigarette. [facility].  [id number]   Samuel MassonRandi Kristan Emmeline Myers CES, ACSM 07/03/2017 9:19 AM

## 2018-10-09 IMAGING — CT CT ANGIO CHEST-ABD-PELV FOR DISSECTION W/ AND WO/W CM
2 of 10 series · 14 of 46 positions shown, 16 images · IV contrast (iopamidol)
Comparison: 06/30/2017 radiograph

CLINICAL DATA: Central chest pain

EXAM:
CT ANGIOGRAPHY CHEST, ABDOMEN AND PELVIS
TECHNIQUE: Multidetector CT imaging through the chest, abdomen and pelvis was
performed using the standard protocol during bolus administration of
intravenous contrast. Multiplanar reconstructed images and MIPs were
obtained and reviewed to evaluate the vascular anatomy.
CONTRAST:  100mL DTBFEG-ES5 IOPAMIDOL (DTBFEG-ES5) INJECTION 76%

[Series 7: dissection 3.0 i30f 3 · axial · 0.81mm/px · z∈[+780,+1328]mm · 11 of 211 slices shown, 13 images]
[im 14/211  soft-tissue]
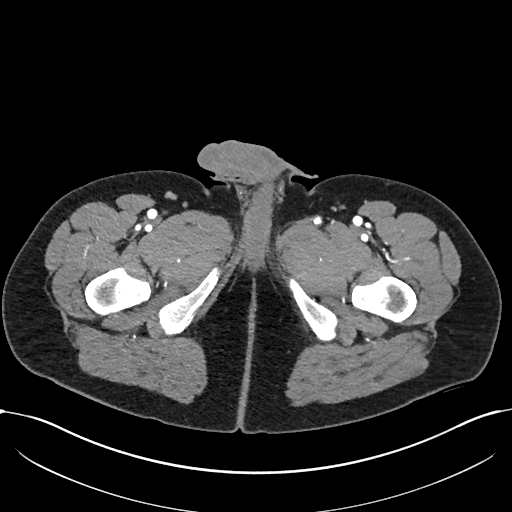
[im 14/211  bone]
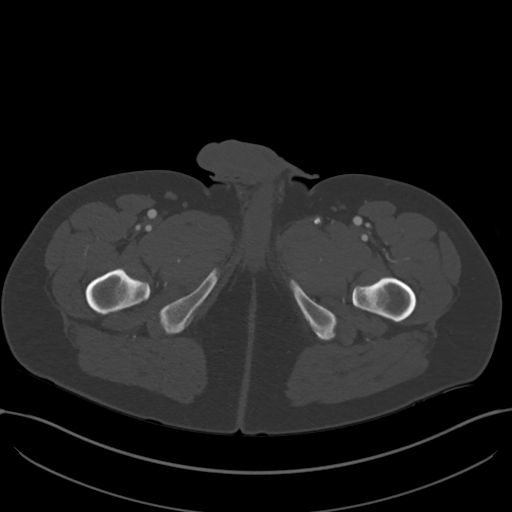
[im 40/211  soft-tissue]
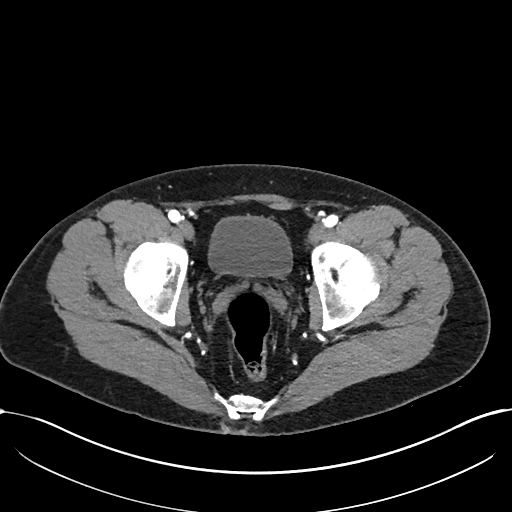
[im 53/211  soft-tissue]
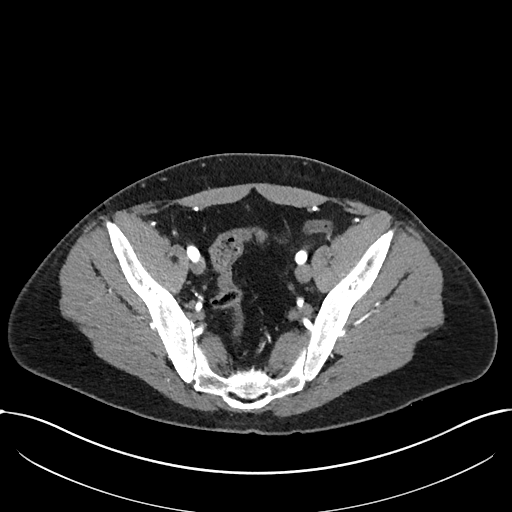
[im 66/211  soft-tissue]
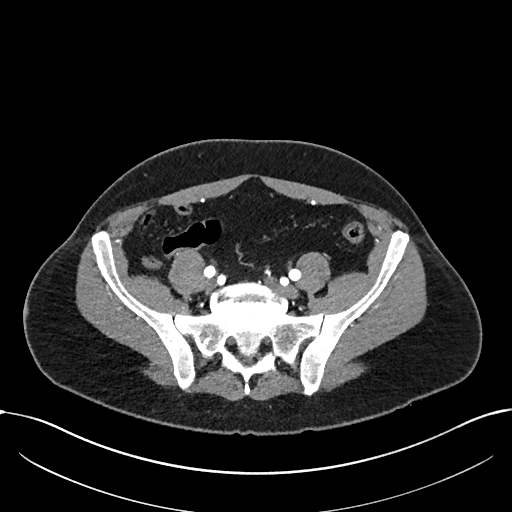
[im 92/211  soft-tissue]
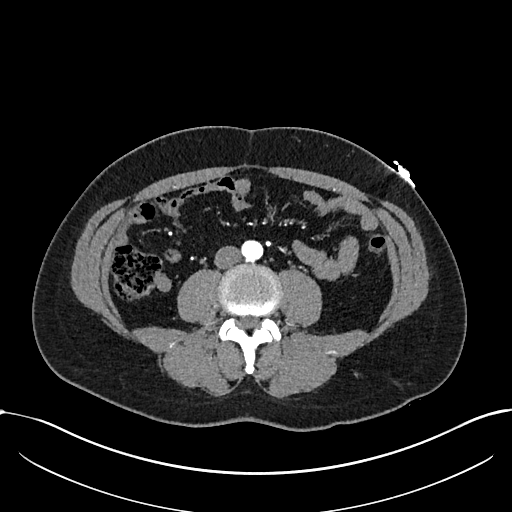
[im 106/211  soft-tissue]
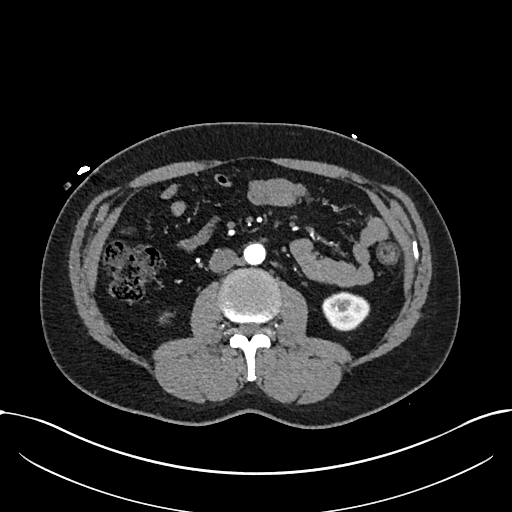
[im 119/211  soft-tissue]
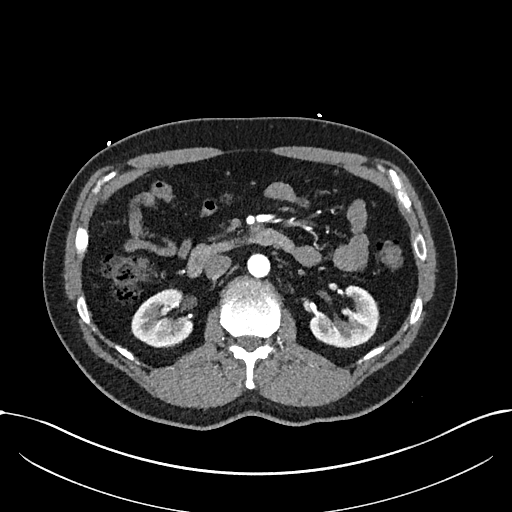
[im 145/211  soft-tissue]
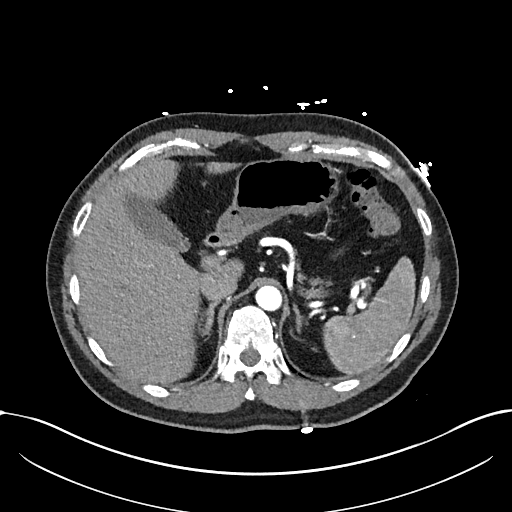
[im 158/211  soft-tissue]
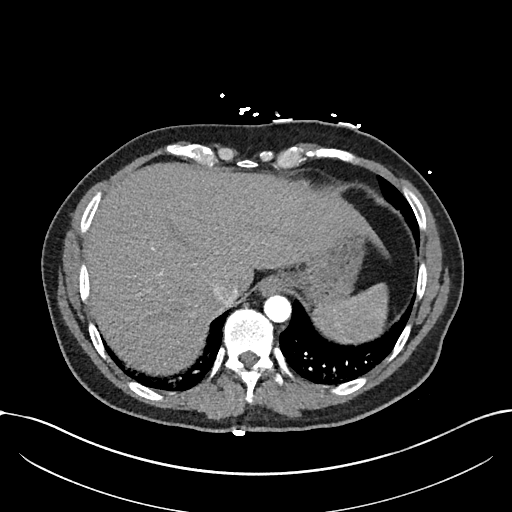
[im 158/211  bone]
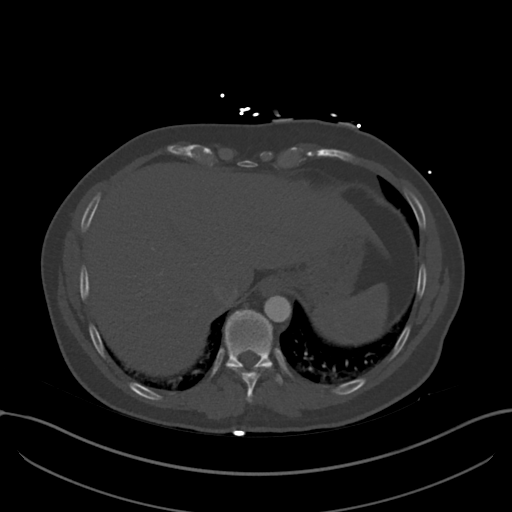
[im 171/211  soft-tissue]
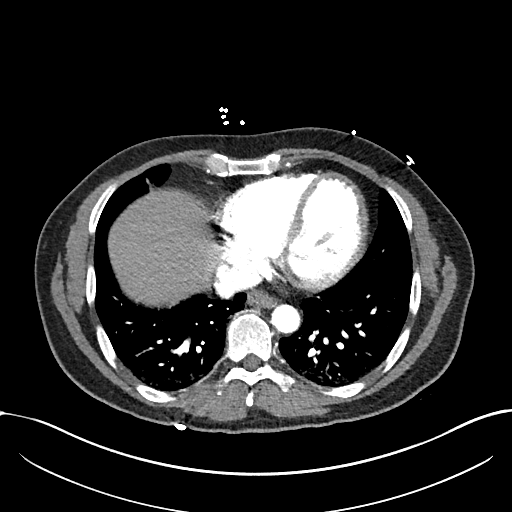
[im 197/211  soft-tissue]
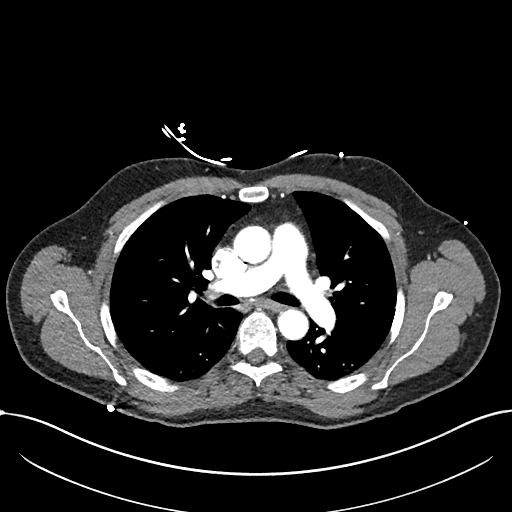

[Series 10: coronals · coronal · 0.79mm/px · 3 of 157 slices shown]
[im 40/157  soft-tissue]
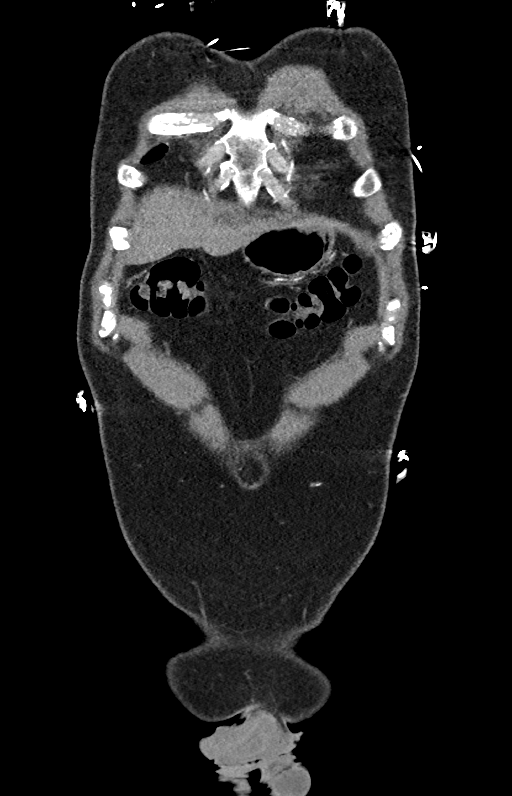
[im 79/157  soft-tissue]
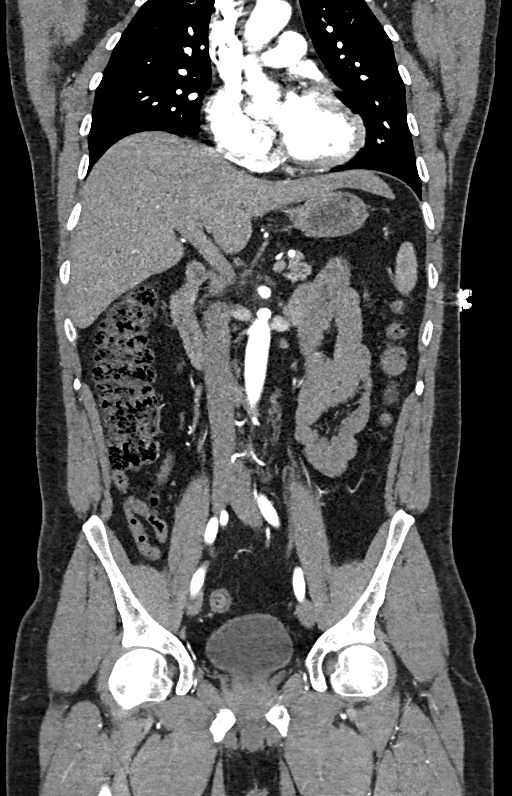
[im 118/157  soft-tissue]
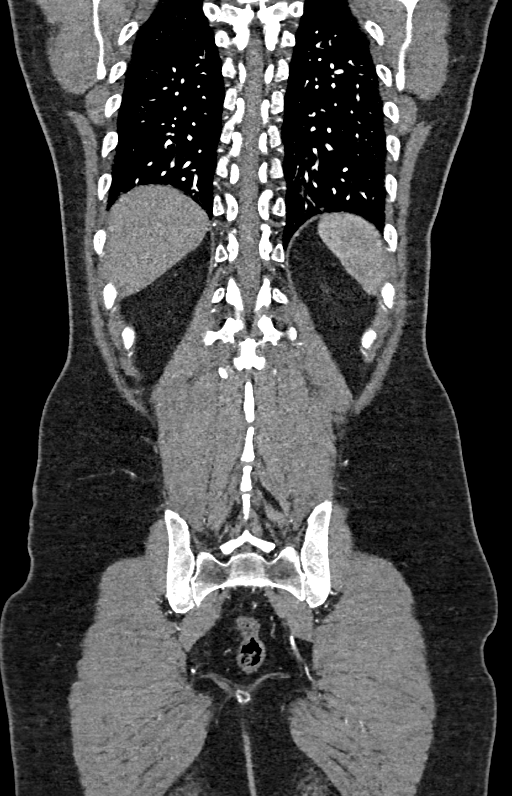

[14 of 46 positions shown; findings below may reference images not displayed]

FINDINGS: CTA CHEST FINDINGS

Cardiovascular: The extreme lung apices were not included on the
initial arterial phase images and a subsequent set of images is
submitted which includes the lung apices and upper mediastinum.
Nonaneurysmal aorta. Non contrasted images demonstrate no intramural
hematoma. No dissection is seen. Heart size is normal. No
pericardial effusion

Mediastinum/Nodes: No enlarged mediastinal, hilar, or axillary lymph
nodes. Thyroid gland, trachea, and esophagus demonstrate no
significant findings.

Lungs/Pleura: Lungs are clear. No pleural effusion or pneumothorax.

Musculoskeletal: No chest wall abnormality. No acute or significant
osseous findings.

Review of the MIP images confirms the above findings.

CTA ABDOMEN AND PELVIS FINDINGS

VASCULAR

Aorta: Normal caliber aorta without aneurysm, dissection, vasculitis
or significant stenosis.

Celiac: Patent without evidence of aneurysm, dissection, vasculitis
or significant stenosis.

SMA: Patent without evidence of aneurysm, dissection, vasculitis or
significant stenosis.

Renals: Both renal arteries are patent without evidence of aneurysm,
dissection, vasculitis, fibromuscular dysplasia or significant
stenosis.

IMA: Patent without evidence of aneurysm, dissection, vasculitis or
significant stenosis.

Inflow: Patent without evidence of aneurysm, dissection, vasculitis
or significant stenosis. Minimal atherosclerotic calcification at
the origin of right internal iliac.

Veins: No obvious venous abnormality within the limitations of this
arterial phase study.

Review of the MIP images confirms the above findings.

NON-VASCULAR

Hepatobiliary: No focal liver abnormality is seen. No gallstones,
gallbladder wall thickening, or biliary dilatation.

Pancreas: Unremarkable. No pancreatic ductal dilatation or
surrounding inflammatory changes.

Spleen: Normal in size without focal abnormality.

Adrenals/Urinary Tract: Adrenal glands are within normal limits. No
hydronephrosis. 17 mm hypodensity in the mid right kidney with
slight increased density values. Bladder normal

Stomach/Bowel: Stomach is within normal limits. Appendix appears
normal. No evidence of bowel wall thickening, distention, or
inflammatory changes. Minimal distal esophageal thickening.

Lymphatic: No significantly enlarged lymph nodes

Reproductive: Prostate is unremarkable.

Other: Negative for free air or free fluid. Small fat in the
umbilical region

Musculoskeletal: No acute or significant osseous findings.

Review of the MIP images confirms the above findings.
IMPRESSION: 1. Negative for acute aortic dissection or aneurysm.
2. Clear lung fields.
3. No significant vascular stenosis within the abdomen or pelvis
4. 17 mm hypodense lesion mid right kidney, not compatible with
simple cyst. When the patient is clinically stable and able to
follow directions and hold their breath (preferably as an
outpatient) further evaluation with dedicated abdominal MRI should
be considered.
5. Minimal distal esophageal thickening, could be due to mild
esophagitis or reflux.

## 2018-10-09 IMAGING — CR DG CHEST 2V
2 series · 2 of 2 positions shown · non-contrast
Comparison: None.

CLINICAL DATA: Chest pain for 2 days. Diaphoresis. Nausea and
vomiting. Chills.

EXAM:
CHEST  2 VIEW

[chest pa]
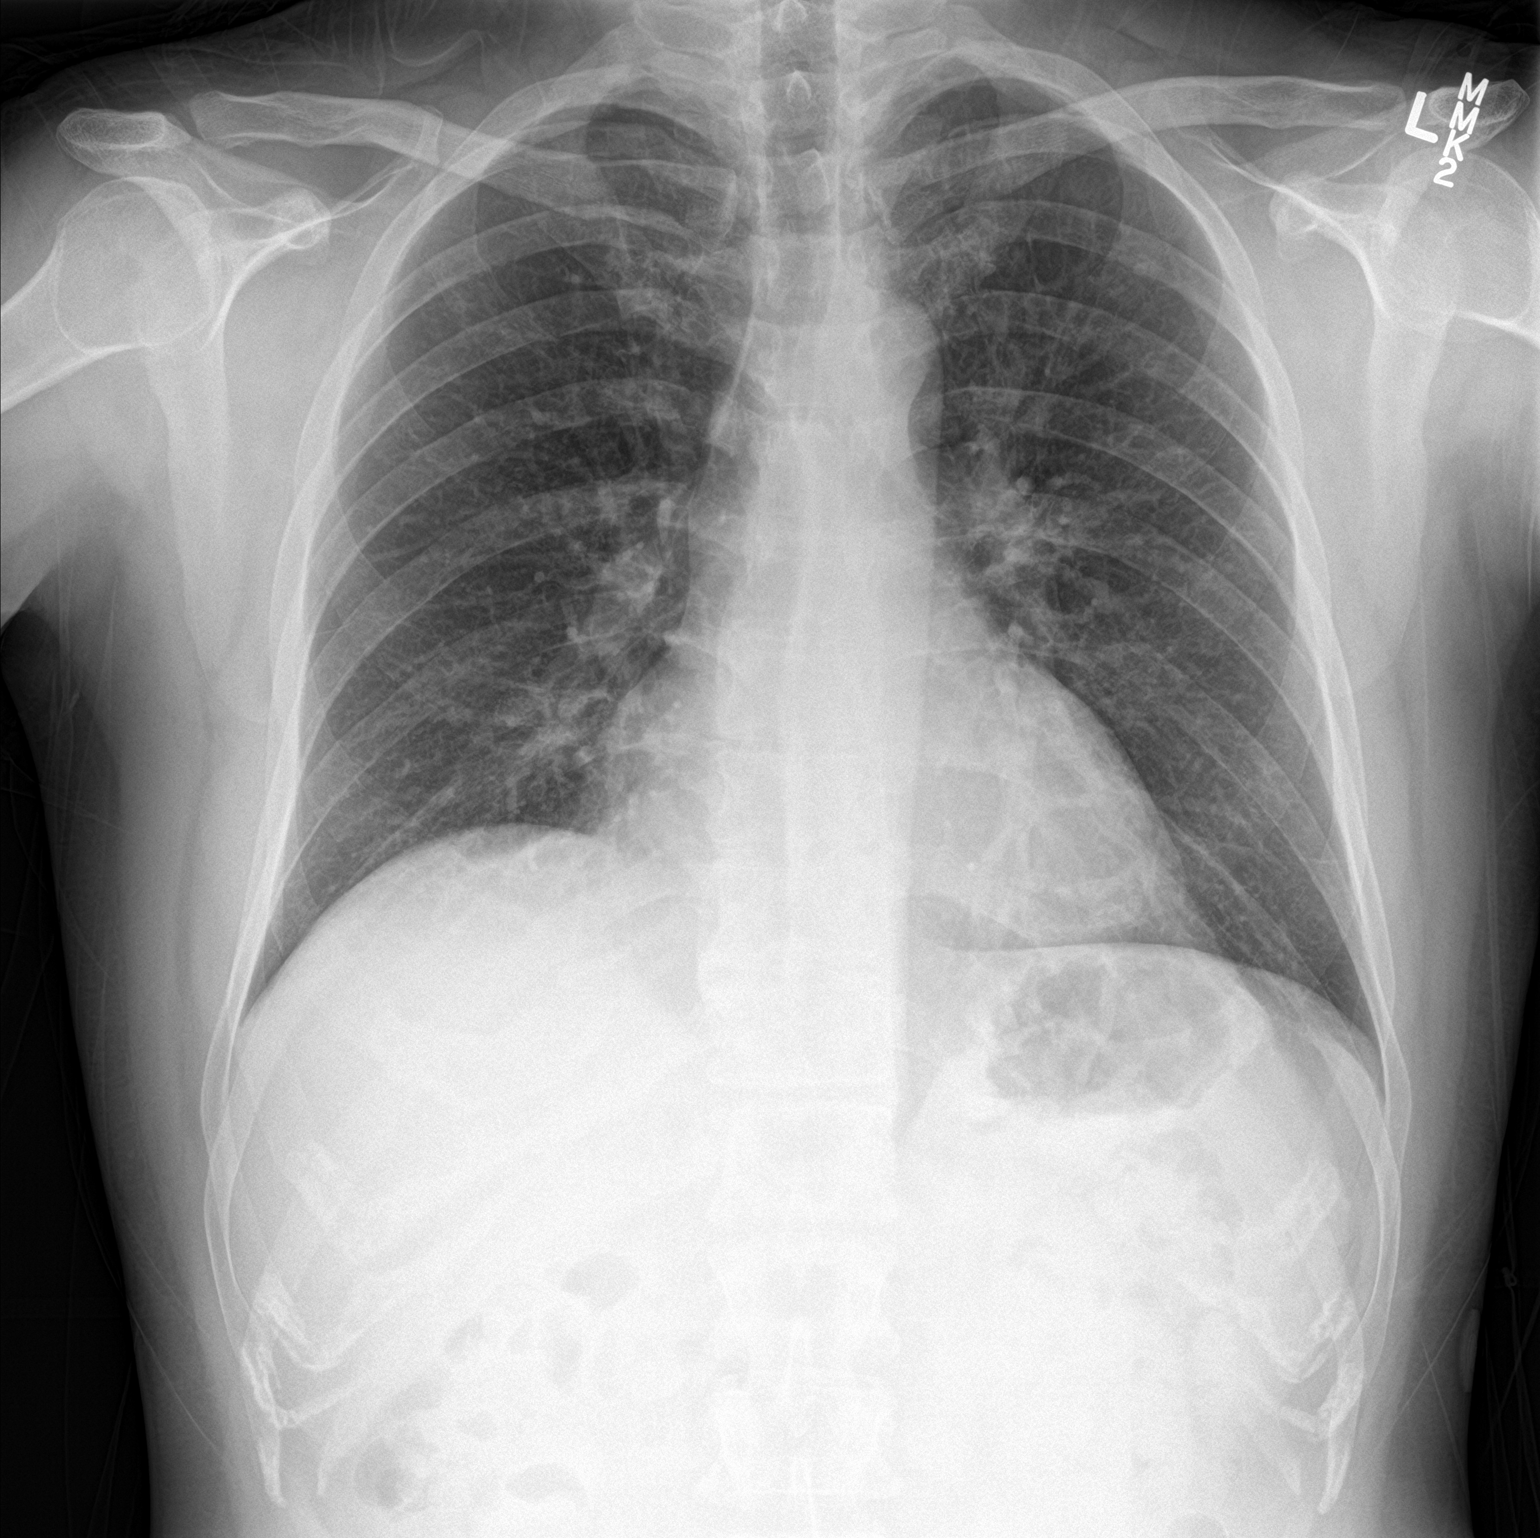

[chest lat]
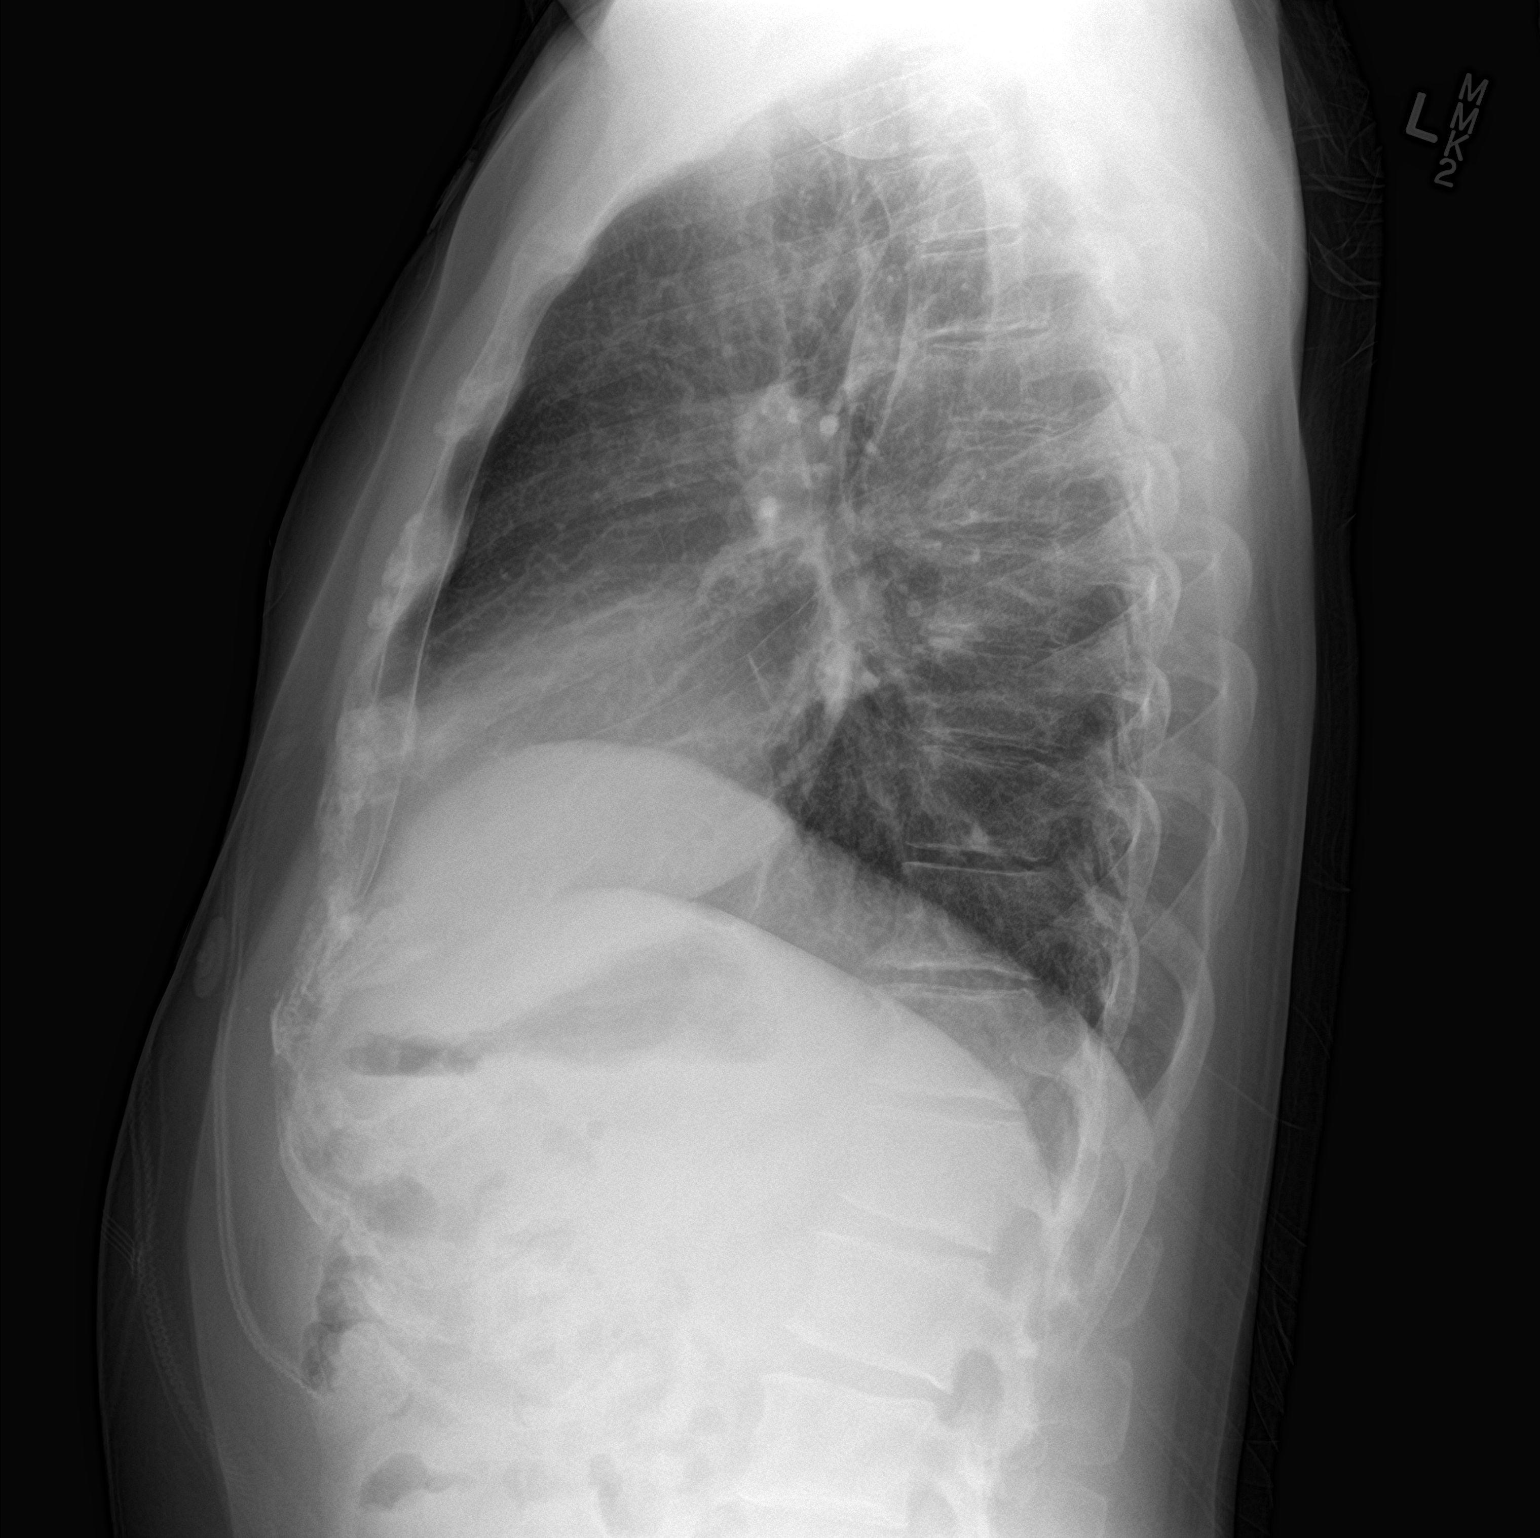

[2 of 2 positions shown; findings below may reference images not displayed]

FINDINGS: The heart size and mediastinal contours are within normal limits.
Both lungs are clear. The visualized skeletal structures are
unremarkable.
IMPRESSION: No active cardiopulmonary disease.
